# Patient Record
Sex: Female | Born: 1975
Health system: Southern US, Community
[De-identification: ages and names within clinical notes are randomized; demographics above are authoritative.]

## PROBLEM LIST (undated history)

## (undated) ENCOUNTER — Inpatient Hospital Stay (HOSPITAL_COMMUNITY): Payer: Self-pay

## (undated) DIAGNOSIS — N979 Female infertility, unspecified: Secondary | ICD-10-CM

## (undated) DIAGNOSIS — O24419 Gestational diabetes mellitus in pregnancy, unspecified control: Secondary | ICD-10-CM

## (undated) DIAGNOSIS — IMO0002 Reserved for concepts with insufficient information to code with codable children: Secondary | ICD-10-CM

## (undated) DIAGNOSIS — I10 Essential (primary) hypertension: Secondary | ICD-10-CM

## (undated) DIAGNOSIS — R51 Headache: Secondary | ICD-10-CM

## (undated) DIAGNOSIS — E039 Hypothyroidism, unspecified: Secondary | ICD-10-CM

## (undated) DIAGNOSIS — R87619 Unspecified abnormal cytological findings in specimens from cervix uteri: Secondary | ICD-10-CM

## (undated) HISTORY — PX: TONSILLECTOMY: SUR1361

## (undated) HISTORY — PX: LEEP: SHX91

## (undated) SURGERY — Surgical Case
Anesthesia: *Unknown

---

## 2006-04-01 LAB — HEPATITIS B SURFACE ANTIBODY,QUALITATIVE: Hep B S Ab: POSITIVE

## 2010-08-12 ENCOUNTER — Inpatient Hospital Stay (HOSPITAL_COMMUNITY)
Admission: AD | Admit: 2010-08-12 | Discharge: 2010-08-14 | DRG: 779 | Disposition: A | Discharge status: Home / Self Care | Payer: 59 | Source: Ambulatory Visit | Attending: Obstetrics | Admitting: Obstetrics

## 2010-08-12 DIAGNOSIS — E079 Disorder of thyroid, unspecified: Secondary | ICD-10-CM | POA: Diagnosis present

## 2010-08-12 DIAGNOSIS — O4100X0 Oligohydramnios, unspecified trimester, not applicable or unspecified: Secondary | ICD-10-CM | POA: Diagnosis present

## 2010-08-12 DIAGNOSIS — O034 Incomplete spontaneous abortion without complication: Principal | ICD-10-CM | POA: Diagnosis present

## 2010-08-12 DIAGNOSIS — O4290 Premature rupture of membranes, unspecified as to length of time between rupture and onset of labor, unspecified weeks of gestation: Secondary | ICD-10-CM

## 2010-08-12 DIAGNOSIS — O99280 Endocrine, nutritional and metabolic diseases complicating pregnancy, unspecified trimester: Secondary | ICD-10-CM | POA: Diagnosis present

## 2010-08-12 DIAGNOSIS — E039 Hypothyroidism, unspecified: Secondary | ICD-10-CM | POA: Diagnosis present

## 2010-08-13 ENCOUNTER — Inpatient Hospital Stay (HOSPITAL_COMMUNITY): Payer: 59

## 2010-08-13 LAB — DIFFERENTIAL
Eosinophils Absolute: 0.3 10*3/uL (ref 0.0–0.7)
Eosinophils Relative: 2 % (ref 0–5)
Lymphocytes Relative: 21 % (ref 12–46)
Lymphs Abs: 3 10*3/uL (ref 0.7–4.0)
Monocytes Relative: 7 % (ref 3–12)

## 2010-08-13 LAB — CBC
HCT: 37.5 % (ref 36.0–46.0)
HCT: 36.7 % (ref 36.0–46.0)
Hemoglobin: 13 g/dL (ref 12.0–15.0)
Hemoglobin: 12.5 g/dL (ref 12.0–15.0)
MCH: 32.5 pg (ref 26.0–34.0)
MCH: 32.6 pg (ref 26.0–34.0)
MCHC: 34.7 g/dL (ref 30.0–36.0)
MCV: 95.1 fL (ref 78.0–100.0)
MCV: 94.8 fL (ref 78.0–100.0)
Platelets: 259 10*3/uL (ref 150–400)
RBC: 3.96 MIL/uL (ref 3.87–5.11)
RBC: 3.85 MIL/uL — ABNORMAL LOW (ref 3.87–5.11)
RBC: 3.87 MIL/uL (ref 3.87–5.11)
WBC: 13.7 10*3/uL — ABNORMAL HIGH (ref 4.0–10.5)
WBC: 14.3 10*3/uL — ABNORMAL HIGH (ref 4.0–10.5)
WBC: 14.7 10*3/uL — ABNORMAL HIGH (ref 4.0–10.5)

## 2010-08-13 LAB — URINE MICROSCOPIC-ADD ON: WBC, UA: NONE SEEN WBC/hpf (ref <3)

## 2010-08-13 LAB — URINALYSIS, ROUTINE W REFLEX MICROSCOPIC
Glucose, UA: NEGATIVE mg/dL
Leukocytes, UA: NEGATIVE
Specific Gravity, Urine: <1.005 — ABNORMAL LOW (ref 1.005–1.030)
pH: 6 (ref 5.0–8.0)

## 2010-08-13 LAB — WET PREP, GENITAL

## 2010-08-13 LAB — TYPE AND SCREEN: ABO/RH(D): A POS

## 2010-08-14 ENCOUNTER — Other Ambulatory Visit: Payer: Self-pay | Admitting: Obstetrics and Gynecology

## 2010-08-14 LAB — URINE CULTURE

## 2010-08-18 ENCOUNTER — Inpatient Hospital Stay (HOSPITAL_COMMUNITY)
Admission: AD | Admit: 2010-08-18 | Discharge: 2010-08-18 | Disposition: A | Discharge status: Home / Self Care | Payer: 59 | Source: Ambulatory Visit | Attending: Obstetrics and Gynecology | Admitting: Obstetrics and Gynecology

## 2010-08-18 ENCOUNTER — Inpatient Hospital Stay (HOSPITAL_COMMUNITY): Payer: 59

## 2010-08-18 DIAGNOSIS — O034 Incomplete spontaneous abortion without complication: Secondary | ICD-10-CM | POA: Insufficient documentation

## 2010-08-18 LAB — URINALYSIS, ROUTINE W REFLEX MICROSCOPIC
Bilirubin Urine: NEGATIVE
Ketones, ur: NEGATIVE mg/dL
Leukocytes, UA: NEGATIVE
Nitrite: NEGATIVE
Protein, ur: NEGATIVE mg/dL
Urobilinogen, UA: 0.2 mg/dL (ref 0.0–1.0)

## 2010-08-18 LAB — CBC
MCH: 33.6 pg (ref 26.0–34.0)
MCHC: 34.8 g/dL (ref 30.0–36.0)
MCV: 96.5 fL (ref 78.0–100.0)
Platelets: 287 10*3/uL (ref 150–400)
RBC: 4.23 MIL/uL (ref 3.87–5.11)
RDW: 12.8 % (ref 11.5–15.5)

## 2010-08-18 LAB — URINE MICROSCOPIC-ADD ON

## 2010-08-25 DEATH — deceased

## 2010-08-27 NOTE — Discharge Summary (Signed)
  NAMEMARIKO, Sheila Evans NO.:  [id number]  MEDICAL RECORD NO.:  [id number]  LOCATION:  9374                          FACILITY:  [facility]  PHYSICIAN:  Maxie Better, M.D.DATE OF BIRTH:  12-14-1975  DATE OF ADMISSION:  08/12/2010 DATE OF DISCHARGE:  08/14/2010                              DISCHARGE SUMMARY   ADMISSION DIAGNOSES:  Preterm premature rupture of membranes, intrauterine gestation at 26 1/7 weeks, hypothyroidism.  DISCHARGE DIAGNOSES:  18 1/7 weeks delivered, hypothyroidism, preterm premature rupture of membranes.  PROCEDURE:  Second trimester termination of pregnancy.  HISTORY OF PRESENT ILLNESS:  This is a 35 year old, gravida 2, para 0-0- 1-0 female at 18-1/7 weeks by first trimester ultrasound with known hypothyroidism, on Synthroid, who presented to the hospital with feeling a pop while straining for bowel movement.  The patient was subsequently diagnosed with rupture of membranes confirmed by ultrasound.  HOSPITAL COURSE:  The patient underwent an ultrasound that showed fundal placenta breech, gross loss of fluid noted, limited ultrasound. Patient had CBC done as well as Hemaglobin A1C which was mildly increased at 6.3 The patient does not have a history of abnormal glucose or diabetes. Several CBC was done to assess for  evidence of immediate infection. On admission, CBC showed wbc 13.7, hemaglobin 12, hematocrit of 37, platelet count of 264,000. Repeat CBC prior to procedure showed wbc of 14.7, hemaglobin 12.6, hematocrit of 36.7, platelet count of 259,000. The patient  remained afebrile and uterus was not tender. Amniotic fluid was without any odor. The patient was offered Maternal fetal medicine consult and opted to have that input.  After deliberation post MFM consult, the couple opted to terminate the pregnancy.  The patient was transferred to the Intensive Care Unit where she underwent Cytotec induction of her second trimester  procedure. After 600 mcg of Cytotec placed vaginally, the patient subsequently delivered a nonviable female and  placenta, which subsequently came out intact and minimal bleeding was noted thereafter.  After being monitored carefully, the patient was ultimately discharged home and was not felt to need any D and C due to the minimal bleeding.  Uterus was firm and the placenta appearing intact, specimen all of which was sent to Pathology.  The patient was grieving appropriately.  She was given information on [facility].  The patient was subsequently discharged home.   DISPOSITION:  Home.  CONDITION:  Stable.  DISCHARGE MEDICATIONS:  Xanax 0.25 mg p.o. t.i.d. p.r.n. per the patient's request.  Continue her Synthroid dosage as scheduled.  The patient already had Motrin at home.  DISCHARGE INSTRUCTIONS:  Call for temperature greater than or equal to 100.4, nothing per vagina for 2-3 weeks.  Call if soaking the regular maxi pad every hour or more frequently or passage of clots.  FOLLOW UP: [facility] 2 wk  Maxie Better, M.D.     [place]/[facility]  D:  08/26/2010  T:  08/27/2010  Job:  [id number]  Electronically Signed by Nena Jordan Salah Burlison M.D. on 08/27/2010 06:25:42 AM

## 2010-10-26 ENCOUNTER — Inpatient Hospital Stay (HOSPITAL_COMMUNITY): Admission: AD | Admit: 2010-10-26 | Payer: Self-pay | Source: Ambulatory Visit | Admitting: Obstetrics and Gynecology

## 2011-04-10 ENCOUNTER — Other Ambulatory Visit (HOSPITAL_COMMUNITY): Payer: Self-pay | Admitting: Obstetrics and Gynecology

## 2011-04-10 DIAGNOSIS — IMO0001 Reserved for inherently not codable concepts without codable children: Secondary | ICD-10-CM

## 2011-04-10 DIAGNOSIS — N883 Incompetence of cervix uteri: Secondary | ICD-10-CM

## 2011-04-11 ENCOUNTER — Ambulatory Visit (HOSPITAL_COMMUNITY)
Admission: RE | Admit: 2011-04-11 | Discharge: 2011-04-11 | Disposition: A | Discharge status: Home / Self Care | Payer: 59 | Source: Ambulatory Visit | Attending: Obstetrics and Gynecology | Admitting: Obstetrics and Gynecology

## 2011-04-11 ENCOUNTER — Encounter (HOSPITAL_COMMUNITY): Payer: Self-pay

## 2011-04-11 DIAGNOSIS — O30009 Twin pregnancy, unspecified number of placenta and unspecified number of amniotic sacs, unspecified trimester: Secondary | ICD-10-CM | POA: Insufficient documentation

## 2011-04-11 DIAGNOSIS — O09299 Supervision of pregnancy with other poor reproductive or obstetric history, unspecified trimester: Secondary | ICD-10-CM

## 2011-04-11 DIAGNOSIS — O99280 Endocrine, nutritional and metabolic diseases complicating pregnancy, unspecified trimester: Secondary | ICD-10-CM | POA: Insufficient documentation

## 2011-04-11 DIAGNOSIS — E039 Hypothyroidism, unspecified: Secondary | ICD-10-CM | POA: Insufficient documentation

## 2011-04-11 DIAGNOSIS — O3440 Maternal care for other abnormalities of cervix, unspecified trimester: Secondary | ICD-10-CM | POA: Insufficient documentation

## 2011-04-11 DIAGNOSIS — IMO0001 Reserved for inherently not codable concepts without codable children: Secondary | ICD-10-CM

## 2011-04-11 DIAGNOSIS — E079 Disorder of thyroid, unspecified: Secondary | ICD-10-CM | POA: Insufficient documentation

## 2011-04-11 DIAGNOSIS — O30049 Twin pregnancy, dichorionic/diamniotic, unspecified trimester: Secondary | ICD-10-CM

## 2011-04-11 DIAGNOSIS — N883 Incompetence of cervix uteri: Secondary | ICD-10-CM

## 2011-04-12 NOTE — Progress Notes (Signed)
Obstetric ultrasound performed today.    Patient presented today for second opion regarding her cervical length and pregnancy management.    Transvaginal evaluation of cervical length reveals a residual length of 2.9cm.  There does appear to be a moderate amount of funnelling, but differentiation of the lower uterus from the cervix can be challenging at this gestational age.  Given this, interpretation of these findings is difficult.  This was discussed with the patient today.  Formal MFM consultation was also completed today (see separate documentation).    Gestation appears to have 2 separate placental discs, a thick dividing membrane and a lambda sign consistent with dichorionic placentation.   Diamniotic, dichorionic twin gestation at 13 weeks 6 days No gross abnormalities Equivocal cervical length   Recommend repeat evaluation of cervical length in 10-14 days.  Recommend repeat ultrasound for evaluation of fetal anatomy in 5 weeks.  We would be pleased to perform this exam.  If desired, please call to schedule.   Please see MFM consult for further recommendations.

## 2011-04-12 NOTE — Progress Notes (Signed)
MFM CONSULT  36 y/o G3P0020 at 60 6/7 weeks with dichorionic twin gestation and prior history of 2nd trimester PPROM presents for consultation for recommendations regarding pregnancy management.  Gestation is IVP conception with donor sperm (husband has a prior vasectomy).   No complaints today.   Medical history is significant for hypothyroidism.  Patient is currently treated with Synthroid 125 mcg daily.  Prenatal records indicate normal TSH is peri conception period.   No other significant medical history.  Surgical history is significant for a prior LEEP.    Obstetric history includes a TAB in 2002 in early first trimester.  Patient also has a history of PPROM at 18 weeks in June 2012.  Patient reports presenting after she experienced loss of large amount of fluid per vagina while straining to have a bowel movement.  She denies any other symptoms preceding this event.  She reports electing to proceed with Cytotec induction due to the overall poor prognosis and states she was told her cervix was closed and long at the initiation of the induction.  She had no post delivery complications.    Patient expresses extreme concern and anxiety regarding the possibility of pregnancy loss with this gestation.  The uncertain benefit of cerclage and progesterone therapy in twin gestations was discussed today.  The limitations of cervical length ultrasound at this gestational age was also described.    Options for pregnancy management were reviewed including prophylactic cerclage in the next 1-2 weeks, serial cervical length evaluations, and IM weekly progesterone injections.  The risks and potential benefits of each were described and questions were answered.    Given prior cervical manipulation with a TAB and LEEP, the question of some changes on cervical ultrasound currently, and a prior history of second trimester loss, patient may benefit from a prophylactic cerclage, but the possible reduced effectiveness of  this approach in a twin gestation and the uncertain outcome with or without any of the described interventions was emphasized.   Patient desires to consider her options and discuss these further with her primary MD.    Recommendations were provided for a repeat cervical length in 10-14 days.  Concerning signs and symptoms were discussed.   Also, recommend TSH evaluation at least every trimester during gestation and at 6 weeks post partum to maintain euthyroid state.  Benefits of this were briefly reviewed.   We would be pleased to participate further in the care of this patient as desired.  40 min counseling time

## 2011-04-14 ENCOUNTER — Ambulatory Visit (HOSPITAL_COMMUNITY): Admit: 2011-04-14 | Discharge: 2011-04-14 | Disposition: A | Discharge status: Home / Self Care | Payer: 59

## 2011-04-14 ENCOUNTER — Ambulatory Visit (HOSPITAL_COMMUNITY)
Admit: 2011-04-14 | Discharge: 2011-04-14 | Disposition: A | Discharge status: Home / Self Care | Payer: 59 | Attending: Obstetrics and Gynecology | Admitting: Obstetrics and Gynecology

## 2011-04-14 ENCOUNTER — Encounter (HOSPITAL_COMMUNITY): Payer: Self-pay | Admitting: *Deleted

## 2011-04-14 ENCOUNTER — Inpatient Hospital Stay (HOSPITAL_COMMUNITY): Payer: 59

## 2011-04-14 ENCOUNTER — Other Ambulatory Visit (HOSPITAL_COMMUNITY): Payer: Self-pay | Admitting: Obstetrics and Gynecology

## 2011-04-14 ENCOUNTER — Inpatient Hospital Stay (HOSPITAL_COMMUNITY)
Admission: AD | Admit: 2011-04-14 | Discharge: 2011-04-14 | Disposition: A | Discharge status: Home / Self Care | Payer: 59 | Source: Ambulatory Visit | Attending: Obstetrics and Gynecology | Admitting: Obstetrics and Gynecology

## 2011-04-14 DIAGNOSIS — IMO0001 Reserved for inherently not codable concepts without codable children: Secondary | ICD-10-CM

## 2011-04-14 DIAGNOSIS — O4290 Premature rupture of membranes, unspecified as to length of time between rupture and onset of labor, unspecified weeks of gestation: Secondary | ICD-10-CM

## 2011-04-14 DIAGNOSIS — O200 Threatened abortion: Secondary | ICD-10-CM | POA: Insufficient documentation

## 2011-04-14 DIAGNOSIS — O99280 Endocrine, nutritional and metabolic diseases complicating pregnancy, unspecified trimester: Secondary | ICD-10-CM

## 2011-04-14 DIAGNOSIS — O09299 Supervision of pregnancy with other poor reproductive or obstetric history, unspecified trimester: Secondary | ICD-10-CM

## 2011-04-14 HISTORY — DX: Female infertility, unspecified: N97.9

## 2011-04-14 HISTORY — DX: Unspecified abnormal cytological findings in specimens from cervix uteri: R87.619

## 2011-04-14 HISTORY — DX: Essential (primary) hypertension: I10

## 2011-04-14 HISTORY — DX: Reserved for concepts with insufficient information to code with codable children: IMO0002

## 2011-04-14 HISTORY — DX: Hypothyroidism, unspecified: E03.9

## 2011-04-14 HISTORY — DX: Headache: R51

## 2011-04-14 NOTE — Discharge Instructions (Signed)
Go from MAU to Maternal Fetal Care. Then follow-up with Dr. Cherly Hensen as directed from there. Call [facility] or the MD on call with any concerns beyond appointment today.

## 2011-04-14 NOTE — Progress Notes (Signed)
Had a sharp pain last night across lower abd.  Sneezed this morning, with 2nd sneeze- water exploded, running down legs.  No bleeding.  Feeling a little cramping.  (pt brought directly from lobby to rm via wc

## 2011-04-14 NOTE — Progress Notes (Signed)
Twin gest, 2 sacs.  Had [place] at MFM last wk, cervix 2.3cm on Wed, 2.8cm on Fri.  Hx of srom at 18wks ( was pushing with bowel movement)- states had a feeling something was wrong, very scared and worried.

## 2011-04-14 NOTE — Progress Notes (Signed)
sono done; twin gestation w/ oligo on 1st twin. Had sono last fri @ MFM and was fine.  Pt c/o pinkish d/c and cramping since exam.   IMP: prob PROM  P)MFM

## 2011-04-14 NOTE — ED Notes (Signed)
Patient asking, "Is this normal for them to just let someone go like this?" Informed patient and husband that medications to prevent preterm labor are not appropriate at this gestational age. Informed patient and husband that a cerclage or progesterone shots or bedrest would not have prevented the membranes from rupturing. Attempted to console patient and offer encouragement. Advised that patient and husband ask detailed questions at [facility]. Then F/U with Dr. Cherly Hensen to ensure that they are informed and questions answered regarding what to expect and plan of care.

## 2011-04-14 NOTE — ED Notes (Signed)
Dr. Cherly Hensen called in. States she will consult with [facility]. Will be in to see patient or send patient to [facility].

## 2011-04-14 NOTE — ED Notes (Signed)
Dr. Cherly Hensen states fern slide negative. U/S ordered.

## 2011-04-14 NOTE — Progress Notes (Signed)
History    36 yo G3P0020 MWF now @ 14 2/7 wk twins(DIDI) gestation present for evaluation of c/o leaking of fluid with sneezing. No vaginal bleeding  Chief Complaint  Patient presents with  . Threatened Miscarriage   Cc leaking fluid  OB History    Grav Para Term Preterm Abortions TAB SAB Ect Mult Living   3 0 0 0 2 1 1 0 0 0       Past Medical History  Diagnosis Date  . Headache     teenager  . Hypertension   . Hypothyroid   . Abnormal Pap smear     leep, 3 normal since    Past Surgical History  Procedure Date  . Leep   . Leep   . Tonsillectomy     Family History  Problem Relation Age of Onset  . Anesthesia problems Neg Hx     History  Substance Use Topics  . Smoking status: Former Games developer  . Smokeless tobacco: Never Used  . Alcohol Use: No    Allergies:  Allergies  Allergen Reactions  . Latex Rash    Prescriptions prior to admission  Medication Sig Dispense Refill  . levothyroxine (SYNTHROID, LEVOTHROID) 125 MCG tablet Take 125 mcg by mouth daily.      . Prenatal Vit-Fe Fumarate-FA (PRENATAL MULTIVITAMIN) TABS Take 1 tablet by mouth daily.         Physical Exam   Blood pressure 142/63, pulse 97, temperature 98.6 F (37 C), temperature source Oral, resp. rate 20, height 5' 5.75" (1.67 m), weight 121.564 kg (268 lb), last menstrual period 01/04/2011.  General appearance: alert and cooperative Heart: regular rate and rhythm, S1, S2 normal, no murmur, click, rub or gallop Abdomen: soft, non-tender; bowel sounds normal; no masses,  no organomegaly Pelvic: cervix normal in appearance.  No fluid in vault. Mucoid d/c cervix closed. Gravid uterus Fern neg ED Course  TWIN gestation IUP @ 14 2/7 wk  R/o PROM  P) sonogram  MDM  Beverley Sherrard A, MD 8:58 AM 04/14/2011

## 2011-04-15 ENCOUNTER — Other Ambulatory Visit: Payer: Self-pay

## 2011-04-15 ENCOUNTER — Inpatient Hospital Stay (HOSPITAL_COMMUNITY)
Admission: AD | Admit: 2011-04-15 | Discharge: 2011-04-25 | DRG: 781 | Disposition: A | Discharge status: Home / Self Care | Payer: 59 | Source: Ambulatory Visit | Attending: Obstetrics | Admitting: Obstetrics

## 2011-04-15 DIAGNOSIS — O99280 Endocrine, nutritional and metabolic diseases complicating pregnancy, unspecified trimester: Secondary | ICD-10-CM | POA: Diagnosis present

## 2011-04-15 DIAGNOSIS — E669 Obesity, unspecified: Secondary | ICD-10-CM | POA: Diagnosis present

## 2011-04-15 DIAGNOSIS — O4290 Premature rupture of membranes, unspecified as to length of time between rupture and onset of labor, unspecified weeks of gestation: Secondary | ICD-10-CM | POA: Diagnosis present

## 2011-04-15 DIAGNOSIS — E079 Disorder of thyroid, unspecified: Secondary | ICD-10-CM | POA: Diagnosis present

## 2011-04-15 DIAGNOSIS — O4590 Premature separation of placenta, unspecified, unspecified trimester: Secondary | ICD-10-CM | POA: Diagnosis present

## 2011-04-15 DIAGNOSIS — O034 Incomplete spontaneous abortion without complication: Secondary | ICD-10-CM | POA: Diagnosis not present

## 2011-04-15 DIAGNOSIS — O30049 Twin pregnancy, dichorionic/diamniotic, unspecified trimester: Secondary | ICD-10-CM

## 2011-04-15 DIAGNOSIS — E039 Hypothyroidism, unspecified: Secondary | ICD-10-CM | POA: Diagnosis present

## 2011-04-15 DIAGNOSIS — IMO0002 Reserved for concepts with insufficient information to code with codable children: Principal | ICD-10-CM | POA: Diagnosis not present

## 2011-04-15 DIAGNOSIS — O3430 Maternal care for cervical incompetence, unspecified trimester: Secondary | ICD-10-CM | POA: Diagnosis present

## 2011-04-15 NOTE — Progress Notes (Signed)
MATERNAL FETAL MEDICINE CONSULT  Patient Name: Sheila Evans Medical Record Number:  [id number] Date of Birth: 30-Sep-1975 Requesting Physician Name: Dr. Cherly Hensen Date of Service: 04/15/2011  Chief Complaint PPROM  History of Present Illness Vertie Dibbern was seen today  secondary to PPROM at the request of Dr Cherly Hensen.  The patient is a 36 y.o. G3P0020,with a dichorionic diamniotic IVF twin gestation at [redacted]w[redacted]d with an EDD of 10/11/2011, by Last Menstrual Period confirmed with first trimester ultrasound.  She experienced a gush of fluid earlier today a fter sneezing.  She denies fevers, chills, abdominal pain, contractions, or vaginal bleeding.  Her pregnancy history is significant for a 18 weeks fetal loss secondary to PPROM.  She was seen for a consult in the [facility] on Friday for this history and found to have a transvaginal cervical length of 2.8 cm.  Today's ultrasound shows oligohydramnios of Twin A and normal amniotic fluid volume of Twin B.  Cervical length was 3 cm on transabdominal ultrasound.  Review of Systems Pertinent items are noted in HPI.  Patient History OB History    Grav Para Term Preterm Abortions TAB SAB Ect Mult Living   3 0 0 0 2 1 1 0 0 0      # Outc Date GA Lbr Len/2nd Wgt Sex Del Anes PTL Lv   1 SAB            Comments: 18wks- srom   2 TAB            3 CUR               Past Medical History  Diagnosis Date  . Headache     teenager  . Hypertension   . Hypothyroid   . Abnormal Pap smear     leep, 3 normal since  . Infertility, female     Past Surgical History  Procedure Date  . Leep   . Leep   . Tonsillectomy     History   Social History  . Marital Status: Married    Spouse Name: N/A    Number of Children: N/A  . Years of Education: N/A   Social History Main Topics  . Smoking status: Former Games developer  . Smokeless tobacco: Never Used  . Alcohol Use: No  . Drug Use: No  . Sexually Active: Not Currently   Other Topics Concern  . Not on file    Social History Narrative  . No narrative on file    Family History  Problem Relation Age of Onset  . Anesthesia problems Neg Hx    In addition, the patient has no family history of mental retardation, birth defects, or genetic diseases.  Physical Examination General appearance - alert, well appearing, and in no distress Abdomen - soft, nontender, nondistended, no masses or organomegaly  Assessment and Recommendations 1.  PRROM of Twin A.  I reviewed the ultrasound findings with Ms. Coltrain and her husband and discussed the poor prognosis given PPROM at such and early gestational age.  It is very likely that she will go into labor or develop and infection or abruption prior to fetal viability.  Even if she is fortunate enough to continue pregnancy past viability, there is a very high risk of pulmonary hypoplasia and neonatal death for Twin A due to prolonged oligohydramnios.  Twin B would not have the risk of pulmonary hypoplasia in that circumstance but would likely have siginifcant risks associated with prematurity.  Unfortunately, there are no proven effective interventions  for a woman in Ms. Stempel's situation.  Cerclage placement is contraindicated in PPROM.  Supplemental progesterone has not been shown to prevent preterm birth in multiple gestations.  However, as the patient is wanting to do everything possible it would be reasonable to proceed with weekly IM  progesterone as it has few associated risks.  I have also prescribed a weeks course of amoxicillin 500 mg po tid and a dose of azithromycin 1g po x1 to prolong latency from rupture to onset of labor.  We also discussed the heroic option of cerclage placement should Twin A spontaneously delivery prior to viability.  The patient understands this is unlikely to be possible as labor typically continues after delivery of the first twin such that delivery of Twin B usually follows soon afterward.  Only if labor were to subside after delivery  of Twin A would cerclage be considered.  We will initally follow her with weekly ultrasounds to assess fluid volume of Twin A and cervical length.  I spent 30 minutes with Ms. Shehadeh today of which 50% was face-to-face counseling.   Rema Fendt, MD

## 2011-04-15 NOTE — Progress Notes (Signed)
Pt presents from home states she delivered twin A at home at 2305, has had PROM with twin A x 24 hours. States she did not have pain tonight just felt something passing through the vagina and the baby passed , still attached to the umbilical cord. She states the umbilical cord pulled out and the placenta is still inside, no bleeding now. Baby is wrapped in a cloth, no heartrate detected, no movement.

## 2011-04-15 NOTE — Progress Notes (Signed)
Infant placed in blanket and mom will hold, pt and husband decline chaplain at this time.

## 2011-04-16 ENCOUNTER — Other Ambulatory Visit: Payer: Self-pay | Admitting: Obstetrics and Gynecology

## 2011-04-16 ENCOUNTER — Inpatient Hospital Stay (HOSPITAL_COMMUNITY): Payer: 59

## 2011-04-16 ENCOUNTER — Ambulatory Visit (HOSPITAL_COMMUNITY)
Admit: 2011-04-16 | Discharge: 2011-04-16 | Disposition: A | Discharge status: Home / Self Care | Payer: 59 | Attending: Obstetrics | Admitting: Obstetrics

## 2011-04-16 LAB — MRSA PCR SCREENING: MRSA by PCR: NEGATIVE

## 2011-04-16 LAB — TYPE AND SCREEN
ABO/RH(D): A POS
Antibody Screen: NEGATIVE

## 2011-04-16 LAB — HEMOGLOBIN A1C: Hgb A1c MFr Bld: 6.1 % — ABNORMAL HIGH (ref <5.7)

## 2011-04-16 LAB — CBC
Hemoglobin: 13 g/dL (ref 12.0–15.0)
MCH: 32.4 pg (ref 26.0–34.0)
MCV: 95.2 fL (ref 78.0–100.0)
Platelets: 266 10*3/uL (ref 150–400)
Platelets: 238 10*3/uL (ref 150–400)
RBC: 4 MIL/uL (ref 3.87–5.11)
RDW: 12.8 % (ref 11.5–15.5)
WBC: 15.9 10*3/uL — ABNORMAL HIGH (ref 4.0–10.5)
WBC: 14.8 10*3/uL — ABNORMAL HIGH (ref 4.0–10.5)

## 2011-04-16 LAB — FIBRINOGEN: Fibrinogen: 577 mg/dL — ABNORMAL HIGH (ref 204–475)

## 2011-04-16 LAB — TSH: TSH: 7.016 u[IU]/mL — ABNORMAL HIGH (ref 0.350–4.500)

## 2011-04-16 LAB — APTT: aPTT: 29 seconds (ref 24–37)

## 2011-04-16 MED ORDER — LACTATED RINGERS IV BOLUS (SEPSIS)
1000.0000 mL | Freq: Once | INTRAVENOUS | Status: AC
  Administered 2011-04-15: 1000 mL via INTRAVENOUS

## 2011-04-16 MED ORDER — AMOXICILLIN 500 MG PO CAPS
500.0000 mg | ORAL_CAPSULE | Freq: Once | ORAL | Status: AC
  Administered 2011-04-16: 500 mg via ORAL
  Filled 2011-04-16: qty 1

## 2011-04-16 MED ORDER — ACETAMINOPHEN 325 MG PO TABS
650.0000 mg | ORAL_TABLET | ORAL | Status: DC | PRN
  Administered 2011-04-25: 650 mg via ORAL
  Filled 2011-04-16: qty 2

## 2011-04-16 MED ORDER — AMOXICILLIN 500 MG PO CAPS
500.0000 mg | ORAL_CAPSULE | Freq: Three times a day (TID) | ORAL | Status: AC
  Administered 2011-04-16 – 2011-04-20 (×14): 500 mg via ORAL
  Filled 2011-04-16 (×16): qty 1

## 2011-04-16 MED ORDER — AMOXICILLIN 500 MG PO CAPS: 500.0000 mg | ORAL_CAPSULE | Freq: Three times a day (TID) | ORAL | Status: DC

## 2011-04-16 MED ORDER — LEVOTHYROXINE SODIUM 125 MCG PO TABS
125.0000 ug | ORAL_TABLET | Freq: Every day | ORAL | Status: DC
  Administered 2011-04-16: 125 ug via ORAL
  Filled 2011-04-16 (×2): qty 1

## 2011-04-16 MED ORDER — PRENATAL MULTIVITAMIN CH
1.0000 | ORAL_TABLET | Freq: Every day | ORAL | Status: DC
  Administered 2011-04-16 – 2011-04-24 (×7): 1 via ORAL
  Filled 2011-04-16 (×8): qty 1

## 2011-04-16 MED ORDER — SALINE SPRAY 0.65 % NA SOLN
1.0000 | NASAL | Status: DC | PRN
Start: 2011-04-16 — End: 2011-04-25
  Administered 2011-04-16: 1 via NASAL
  Filled 2011-04-16: qty 44

## 2011-04-16 MED ORDER — LEVOTHYROXINE SODIUM 137 MCG PO TABS
137.0000 ug | ORAL_TABLET | Freq: Every day | ORAL | Status: DC
  Administered 2011-04-17: 137 ug via ORAL
  Administered 2011-04-18: 50 ug via ORAL
  Administered 2011-04-19 – 2011-04-25 (×7): 137 ug via ORAL
  Filled 2011-04-16 (×10): qty 1

## 2011-04-16 MED ORDER — CALCIUM CARBONATE ANTACID 500 MG PO CHEW
2.0000 | CHEWABLE_TABLET | ORAL | Status: DC | PRN
  Administered 2011-04-16 – 2011-04-24 (×7): 400 mg via ORAL
  Filled 2011-04-16 (×2): qty 1
  Filled 2011-04-16 (×6): qty 2

## 2011-04-16 MED ORDER — DOCUSATE SODIUM 100 MG PO CAPS
100.0000 mg | ORAL_CAPSULE | Freq: Every day | ORAL | Status: DC
  Administered 2011-04-16 – 2011-04-24 (×9): 100 mg via ORAL
  Filled 2011-04-16 (×9): qty 1

## 2011-04-16 MED ORDER — ZOLPIDEM TARTRATE 10 MG PO TABS
10.0000 mg | ORAL_TABLET | Freq: Every evening | ORAL | Status: DC | PRN
  Administered 2011-04-16 – 2011-04-24 (×10): 10 mg via ORAL
  Filled 2011-04-16 (×10): qty 1

## 2011-04-16 NOTE — Progress Notes (Signed)
MFM Note  Patient known to MFM service. 36 year old G3P0A2 at 14+4 weeks was admitted last PM after delivering Twin A at home after PPROM earlier that morning. Placenta of Twin A remains undelivered. [place] today revealed Twin B to be active with normal amniotic fluid. Both placentas identified. Cervix appeared long and closed. Since admission, Sheila Evans has received oral antibiotics and IV fluids. She's has some small gushes of BRB but none in several hours. She denies any fevers, cramping or leakage of fluid.  I had a lengthy, detailed discussion with Sheila Evans and her husband. We reviewed the possible management options including expectant management and cerclage placement. Due to the low numbers of this particular clinical situation, there are very little data to help guide management. Sheila Evans is very aware of the risk of infection which could lead quickly to sepsis and death.   Sheila Evans very much would like to have a cerclage placed. It was decided to wait at least 24 more hours before making a final decision. We will plan to look at the pregnancy again tomorrow in the [facility].

## 2011-04-16 NOTE — Progress Notes (Signed)
Vital signs taken prior to ultrasound. Pt oob to bathroom. Pt denies pain and reports pink discharge at this time

## 2011-04-16 NOTE — Progress Notes (Signed)
HD#1, PPROM/ delivery twin A  S: Pt notes no contractions, no abdominal pain, no fevers, pt notes slight blood tinged mucous discharge but no vaginal bleeding since large gush last pm in MAU. Pt again expressing hope to continue with pregnancy of twin A and hoping to be able to receive a cerclage (this is her impression of the next step after MFM consultation w/ Dr. Carney Bern on Mon).   OCeasar Mons Vitals:   04/16/11 0407 04/16/11 0543 04/16/11 0800 04/16/11 0807  BP: 141/58   125/67  Pulse: 93     Temp: 97.9 F (36.6 C) 98 F (36.7 C) 97.7 F (36.5 C)   TempSrc: Oral  Oral   Resp: 20   18  Height:      Weight:      SpO2: 98%      Gen: in bed, no distress CV: RRR Pulm: CTAB Abd: obese, NT, no RUQ pain LE: NT, no edema GU: deferred today  U/s this am, bedside: active movement baby B, no FH measured, placenta B fundal, placenta A in lower segment  Rads u/s last pm: active FH B, fundal placenta B, placenta of A in lower segment, cvx 4 cm long, grossly nl fluid baby B  CBC    Component Value Date/Time   WBC 14.8* 04/16/2011 0538   RBC 3.55* 04/16/2011 0538   HGB 11.5* 04/16/2011 0538   HCT 33.8* 04/16/2011 0538   PLT 238 04/16/2011 0538   MCV 95.2 04/16/2011 0538   MCH 32.4 04/16/2011 0538   MCHC 34.0 04/16/2011 0538   RDW 12.8 04/16/2011 0538   LYMPHSABS 3.0 08/13/2010 1715   MONOABS 1.0 08/13/2010 1715   EOSABS 0.3 08/13/2010 1715   BASOSABS 0.0 08/13/2010 1715    A1C 6.1  TSH/ GC/CT/ Ucx pending  A/P: 36 yo G3P0020 at 14'4 w/ PPROM and delivery of baby A, intact baby B w/ 3-4cm cervical length hopeful for conservation of baby B. Pt w/ risk factors for cervical incompetence (prior TOP/D&C; prior LEEP, h/o PPROM at 18 wks) but cvx was closed w/ no clear funnelling on exams last wk or this wk. Unclear etiology of PPROM. Clinically pt does not seem infected. WBC elevated but slightly decreased from yest (likely hemodilution). Pt would like to be aggressive as possible. She does understand  risks of bleeding, hemorrhage, DIC all of which would necessitate D&C. We also discussed infectious risk and that placental tissue of A represents significant infectious risk, esp in setting of PPROM (usually caused by infection). Pt understands she is remote from viability but still interested in being aggressive. Will await MFM recommendation on management at this point.   Greater than 30 minutes spent on floor with pt examining, counseling and coordinating care.   Bowdy Bair A. 04/16/2011 9:32 AM

## 2011-04-16 NOTE — Progress Notes (Signed)
Pt was previously under my care. Her care has been transferred to Dr. Ernestina Penna per her request  Clarification of notes reviewed; 1) PPROM at 18 wk with other pregnancy was shortly after comprehensive study with normal cervical length and pt did not have shortening or dilation of cervix post rupture and was seen during that event by MFM who concur that there was no evidence of cervical incompetence.   The MFM consult done on 2/15 reported an equivocal cervical length given the gestational age and difficulty in assessment and recommended repeat cervical length 1 wk later

## 2011-04-16 NOTE — Progress Notes (Signed)
Dr. Eber Jones note seen. Will move pt to floor for continued expectant management. Daily FH, routine vitals, CBC in am. Will continue to monitor for signs of infection, bleeding, abruption, delivery of placenta of A and delivery of B. Cont course of po amoxicillin. Elevated TSH noted and dose adjustment made. Will f/u with pt tomorrow after MFM recommendations.   Sheila Evans A. 04/16/2011 7:20 PM

## 2011-04-16 NOTE — Progress Notes (Signed)
UR chart review completed.  

## 2011-04-16 NOTE — Progress Notes (Signed)
New admit - pt rec'd via stretcher from MAU.  G3P0 AB2 at 14.4 week twin gestation.  Delivered twin A at home,  Desires MFM consult in AM re Twin B.  Small amt bright red vaginal bleeding on admission, denies discomfort.  FHR Twin B 152 per U/S.  IV LR inf on admission.  Abd palpated soft, non tender.  Pt transferred easily to bed.  SR up x 2.  VS obtained.  Oriented to room.  Aware of plan of care as discussed with Dr. Prudencio Pair.

## 2011-04-16 NOTE — H&P (Addendum)
Chief Complaint  PPROM/ delivery baby A  History of Present Illness  36 y.o. G3P0020,with a dichorionic diamniotic IUI twin gestation at [redacted]w[redacted]d with an EDD of 10/11/2011, by Last Menstrual Period confirmed with first trimester ultrasound. She experienced a gush of fluid yesterday  after sneezing. She was confirmed ROM of baby A with a normal cervical length. She was counseled by MFM about poor prognosis of twin A and  Started on Azithro and Amoxicillin. She did not start weekly 17-P. Plan was to f/u for u/s in 1 wk.   Her pregnancy history is significant for a 18 weeks fetal loss secondary to PPROM and prior LEEP for CIN 2. At the time of 18 wk PPROM, after counseling pt had cytotec IOL and delivery. Her cervix was apparently closed at that time. She was seen for a consult in the [facility] on Friday for this history and found to have a transvaginal cervical length of 2.8 cm with concern for funnelling. Ultrasound 2/18 shows oligohydramnios of Twin A and normal amniotic fluid volume of Twin B. Cervical length was 3 cm on transabdominal ultrasound.  Pt notes no pain, bleeding only just starting now while in MAU. No fevers. Pt asking for high ligation of cord with attempt to continue pregnancy w/ B. Pt hopeful to have cervical cerclage if no evidence labor or infection after 24 hrs as this was discussed 2 days ago with Dr. Carney Bern.  Pt here w/ baby A, female, in towel.  PMH: Hypothyroidism Obesity HA  PSH: LEEP, D&C (TOP in 2002)  All: latex  Meds: PNV, SYnthroid, Azitho, Amox  PE: Filed Vitals:   04/15/11 2338  BP: 149/89  Pulse: 101  Temp: 98.8 F (37.1 C)  Resp: 20   Gen: appropriately anxious, no distress Abd: obese, NT LE: NT, no edema GU: large gush of blood in toilet, followed by 50cc clot in vagina, cvx FT/ 2 cm long. No further active bleeding, no fetal parts, no cord, no placenta in vagina.  U/s pending  A/P: 36 yo G3P0020 at 14'4 with PPROM of baby A and now with delivery of A.  No evidence of labor or infection. Pt hopeful to maintain her pregnancy of baby B but understands this is a high risk situation with labor, hemorrhage, infection possible. Pt interested in cerclage and we have discussed that I would like further MFM input. At this time will plan u/s to confirm baby B still w/ intact membranes and no evidence abruption of B. Will cont latency abx and f/u after MFM consult. Dr. Ernestina Penna to manage pt and correspond with MFM per pt's request. Unclear etiology of PPROM, suspect cervical incompetence given funnelling last wk and assessment 2d ago by abdominal means only. Pt also at risk for incompetence given prior LEEP and prior D&C.   - sleep d/o. Pt has been on Ambien for past 2 wks and would like to cont tonight  - hypothyroid. TSH to be sent in am  - Obesity, no recent DM screening, borderline A1C (6.3) last yr, will add to blood work in am.   Sheila Evans A. 04/16/2011 1:31 AM   Greater than 60 minutes spent on floor with pt examining, counseling and coordinating care.

## 2011-04-17 ENCOUNTER — Inpatient Hospital Stay (HOSPITAL_COMMUNITY): Payer: 59

## 2011-04-17 LAB — APTT: aPTT: 28 seconds (ref 24–37)

## 2011-04-17 LAB — CBC
HCT: 33.3 % — ABNORMAL LOW (ref 36.0–46.0)
HCT: 34.8 % — ABNORMAL LOW (ref 36.0–46.0)
Hemoglobin: 11.3 g/dL — ABNORMAL LOW (ref 12.0–15.0)
Hemoglobin: 11.8 g/dL — ABNORMAL LOW (ref 12.0–15.0)
MCH: 32.4 pg (ref 26.0–34.0)
MCHC: 33.9 g/dL (ref 30.0–36.0)
MCV: 95.6 fL (ref 78.0–100.0)
RBC: 3.49 MIL/uL — ABNORMAL LOW (ref 3.87–5.11)

## 2011-04-17 LAB — URINE CULTURE: Culture  Setup Time: 201302201338

## 2011-04-17 LAB — PROTIME-INR: Prothrombin Time: 13.5 seconds (ref 11.6–15.2)

## 2011-04-17 MED ORDER — LACTATED RINGERS IV BOLUS (SEPSIS): 1000.0000 mL | Freq: Once | INTRAVENOUS | Status: DC

## 2011-04-17 NOTE — Progress Notes (Signed)
Obstetric ultrasound performed today.  Please see full report in [facility].  Amniotic fluid volume for twin B is WNL.  Transabdominal imaging reveals a reassuring cervical appearance.    Findings and management options, including cerlage, were again discussed at length with Sheila Evans and her husband.  Risks of severe intrauterine infection and the overall uncertain outcome for this pregnancy were emphasized.  After careful consideration, patient would like to proceed with an attempt at cerclage placement.     Intrauterine pregnancy at 14 weeks 5 days Status post delivery of twin A (placenta in situ)  Patient has a clear understanding of risks, uncertain benefit, and poor prognosis for this gestation.  If cerclage will be attempted, placement at this point in time may be the most advantageous as compared to waiting longer.  In patient observation following cerclage placement for at least 7 days is recommended to allow close surveillance for the development of infection, labor, or cerclage failure.   If patient remains undelivered in 1 week with a reassuring clinical status, outpatient management could be attempted with weekly office visits and weekly assessments of cervical length by transabdominal or translabial ultrasound.  Sterile speculum exam could be attempted as indicated.  Readmission at viablity (23-24 weeks) will need to be considered, if gestation is prolonged to that point.     Patient discussed with Dr. Ernestina Penna today.

## 2011-04-17 NOTE — Progress Notes (Signed)
Met w/ pt to discuss u/s and MFM reccs. I spoke to Dr. Rachel Bo today who recc proceeding w/ cervical cerclage. Pt and husband very interested in this option. I d/w them that prognosis is poor for pre-viable PPROM and that there is no guidelines to help guide management. Pt aware risk of infection and that intrauterine infection could lead to sepsis and death. Pt aware that retained placenta of A is risk of infection. No evidence that cerclage will help as no clear cervical insufficiency. Pt aware of all surgical risks but would like to proceed. We discussed plan to watch for 1 wk in hospital then weekly as outpt. Also d/w pt unclear how we will manage her at viability and would co-manage w/ MFM.   Pt still reports no cramping, no LOF, no fevers, no pain.  After I left the room husband came out saying pt needed emergent attention. Pt still w/o cramping  But large gush of blood- 100cc blood PV. Sterile speculum exam done, small clot, no active bleeding, no placenta.  Will plan 1L LR, check coags, CBC and cont plan as previously discussed. Bleeding likely intermittent separation of placenta A. Infection, abruption, ROM B also remains in DDx.  Sheila Evans A. 04/17/2011 6:40 PM

## 2011-04-17 NOTE — Progress Notes (Signed)
Visited with pt while making rounds on unit.  Chaplain was not called initially for loss and bereavement support.  Pt is trying to remain hopeful for twin that she is still carrying and wanted to remain positive.  She did not wish to talk further at this time but is aware of availability of on-going spiritual care support.  Please page as needs arise or as pt requests. [phone]  Sheila Evans 10:26 AM   04/17/11 1000  Clinical Encounter Type  Visited With Patient  Visit Type Initial;Death

## 2011-04-17 NOTE — Progress Notes (Signed)
Nursing 1915:  Pt tolerated Dr. Roseanne Kaufman exam c/o complaints.  Linen was changed, Labs ordered, 1 liiter of LR started at 500cc/hr in [place].  Pt rested for 1.5 hrs, then ambulated to BR.  She was able to void and reported passed another clot, maybe 2.  She discribed them as golf ball size. But states no active bleeding at this time.  FHR of 148/doppler at 1930.   Fey Coghill T. Rubye Oaks RN

## 2011-04-17 NOTE — Progress Notes (Signed)
S: Pt notes mild vaginal spotting, no heavy bleeding, no cramps, no leaking fluid, no pain, no ctx, no fevers.  OCeasar Mons Vitals:   04/16/11 1651 04/16/11 2136 04/17/11 0639 04/17/11 1200  BP: 151/65 144/67 120/78 109/68  Pulse:  79 84 87  Temp: 98.4 F (36.9 C) 98.1 F (36.7 C) 98.1 F (36.7 C) 98.5 F (36.9 C)  TempSrc: Oral Oral Oral Oral  Resp: 16  18 18   Height:      Weight:      SpO2: 98% 99% 97% 98%   Gen: well appearing, slightly anxious CV: RRR Pulm: CTAB Abd: obese, NT, unable to palpate fundus but no tenderness in fundal region LE: no edema GU: def  U/s: planned today  CBC    Component Value Date/Time   WBC 14.4* 04/17/2011 0500   RBC 3.49* 04/17/2011 0500   HGB 11.3* 04/17/2011 0500   HCT 33.3* 04/17/2011 0500   PLT 245 04/17/2011 0500   MCV 95.4 04/17/2011 0500   MCH 32.4 04/17/2011 0500   MCHC 33.9 04/17/2011 0500   RDW 13.0 04/17/2011 0500   LYMPHSABS 3.0 08/13/2010 1715   MONOABS 1.0 08/13/2010 1715   EOSABS 0.3 08/13/2010 1715   BASOSABS 0.0 08/13/2010 1715    A/P: 36 yo w/ IUI di-di pregnancy, PPROM and delivery of fetus A, fetus B intact, both placentas in-utero - Pt w/o clinical evidence infection, though infection most common etiology of PPROM. Pt also w/ 18 wk PPROM in prior pregnancy. WBC elevated but stable, pt remains on Amox. Pt would like to be aggressive in management of fetus B and hopeful to continue this pregnancy. Cerclage being discussed and pt would like to proceed w/ cerclage in hopes that this would help continue pregnancy until viability. MFM consulting and I appreciate their expertise. I again d/w pt my hesitation for proceeding with a cerclage for several reasons: no clear evidence of cervical incompetence (though this a cause for PPROM, pt's cervix long on u/s), retention of placenta A is an infectious risk (esp if infection cause PPROM), placenta A may try to deliver and would not be able if cerclage in place. Pt is aware of risks of infection  that could lead to fetal demise of B, chorio, sepsis and death. Also aware of possible need to remove cerclage, bleeding risks w/ cerclage. I d/w pt that my recommendation would be to d/c home, complete course of abx, give time to see if infection or delivery of placenta A occurs and if pt remains stable w/o evidence of infection (off abx), could consider cerclage in 1 wk. I d/w pt that cerclage placement at any time carries significant risk and that outcome for viable delivery in twin preg w/ pre-viable delivery of 1 twin is poor in any event.  Cerclage is not risk-free. Pt aware. Will await MFM recommendation.   30 minutes in counseling today.  Jobanny Mavis A. 04/17/2011 1:48 PM

## 2011-04-18 ENCOUNTER — Encounter (HOSPITAL_COMMUNITY): Payer: Self-pay | Admitting: Anesthesiology

## 2011-04-18 ENCOUNTER — Inpatient Hospital Stay (HOSPITAL_COMMUNITY): Payer: 59 | Admitting: Anesthesiology

## 2011-04-18 ENCOUNTER — Encounter (HOSPITAL_COMMUNITY)
Admission: AD | Disposition: A | Discharge status: Home / Self Care | Payer: Self-pay | Source: Ambulatory Visit | Attending: Obstetrics

## 2011-04-18 ENCOUNTER — Other Ambulatory Visit: Payer: Self-pay | Admitting: Obstetrics

## 2011-04-18 HISTORY — PX: CERVICAL CERCLAGE: SHX1329

## 2011-04-18 LAB — CBC
HCT: 33 % — ABNORMAL LOW (ref 36.0–46.0)
MCV: 95.9 fL (ref 78.0–100.0)
RBC: 3.44 MIL/uL — ABNORMAL LOW (ref 3.87–5.11)
WBC: 14.6 10*3/uL — ABNORMAL HIGH (ref 4.0–10.5)

## 2011-04-18 SURGERY — CERCLAGE, CERVIX, VAGINAL APPROACH
Anesthesia: Spinal | Site: Vagina | Wound class: Clean Contaminated

## 2011-04-18 MED ORDER — METOCLOPRAMIDE HCL 5 MG/ML IJ SOLN
INTRAMUSCULAR | Status: AC
  Filled 2011-04-18: qty 2

## 2011-04-18 MED ORDER — CEFAZOLIN SODIUM 1-5 GM-% IV SOLN
INTRAVENOUS | Status: DC | PRN
  Administered 2011-04-18: 1 g via INTRAVENOUS

## 2011-04-18 MED ORDER — METOCLOPRAMIDE HCL 5 MG/ML IJ SOLN
10.0000 mg | Freq: Once | INTRAMUSCULAR | Status: AC | PRN
  Administered 2011-04-18: 10 mg via INTRAVENOUS

## 2011-04-18 MED ORDER — LIDOCAINE HCL (PF) 1 % IJ SOLN
INTRAMUSCULAR | Status: AC
  Filled 2011-04-18: qty 5

## 2011-04-18 MED ORDER — FENTANYL CITRATE 0.05 MG/ML IJ SOLN
INTRAMUSCULAR | Status: AC
  Filled 2011-04-18: qty 5

## 2011-04-18 MED ORDER — LACTATED RINGERS IV SOLN
INTRAVENOUS | Status: DC | PRN
  Administered 2011-04-18 (×2): via INTRAVENOUS

## 2011-04-18 MED ORDER — SUCCINYLCHOLINE CHLORIDE 20 MG/ML IJ SOLN
INTRAMUSCULAR | Status: AC
  Filled 2011-04-18: qty 10

## 2011-04-18 MED ORDER — FENTANYL CITRATE 0.05 MG/ML IJ SOLN
INTRAMUSCULAR | Status: AC
  Filled 2011-04-18: qty 2

## 2011-04-18 MED ORDER — BUPIVACAINE-EPINEPHRINE PF 0.25-1:200000 % IJ SOLN
INTRAMUSCULAR | Status: AC
  Filled 2011-04-18: qty 30

## 2011-04-18 MED ORDER — PROPOFOL 10 MG/ML IV EMUL
INTRAVENOUS | Status: AC
  Filled 2011-04-18: qty 20

## 2011-04-18 MED ORDER — BUPIVACAINE IN DEXTROSE 0.75-8.25 % IT SOLN
INTRATHECAL | Status: DC | PRN
  Administered 2011-04-18: 1.2 mL via INTRATHECAL

## 2011-04-18 MED ORDER — CEFAZOLIN SODIUM 1-5 GM-% IV SOLN
INTRAVENOUS | Status: AC
  Filled 2011-04-18: qty 50

## 2011-04-18 MED ORDER — MIDAZOLAM HCL 2 MG/2ML IJ SOLN
INTRAMUSCULAR | Status: AC
  Filled 2011-04-18: qty 2

## 2011-04-18 MED ORDER — LIDOCAINE HCL (CARDIAC) 20 MG/ML IV SOLN
INTRAVENOUS | Status: AC
  Filled 2011-04-18: qty 5

## 2011-04-18 MED ORDER — MEPERIDINE HCL 25 MG/ML IJ SOLN: 6.2500 mg | INTRAMUSCULAR | Status: DC | PRN

## 2011-04-18 MED ORDER — ONDANSETRON HCL 4 MG/2ML IJ SOLN
INTRAMUSCULAR | Status: AC
  Filled 2011-04-18: qty 2

## 2011-04-18 MED ORDER — KETOROLAC TROMETHAMINE 30 MG/ML IJ SOLN
INTRAMUSCULAR | Status: AC
  Filled 2011-04-18: qty 1

## 2011-04-18 MED ORDER — FENTANYL CITRATE 0.05 MG/ML IJ SOLN
25.0000 ug | INTRAMUSCULAR | Status: DC | PRN
  Administered 2011-04-18 (×2): 25 ug via INTRAVENOUS

## 2011-04-18 MED ORDER — DEXAMETHASONE SODIUM PHOSPHATE 10 MG/ML IJ SOLN
INTRAMUSCULAR | Status: AC
  Filled 2011-04-18: qty 1

## 2011-04-18 SURGICAL SUPPLY — 17 items
CATH ROBINSON RED A/P 16FR (CATHETERS) IMPLANT
CLOTH BEACON ORANGE TIMEOUT ST (SAFETY) ×2 IMPLANT
COUNTER NEEDLE 1200 MAGNETIC (NEEDLE) IMPLANT
GLOVE BIOGEL M 6.5 STRL (GLOVE) ×4 IMPLANT
GLOVE BIOGEL PI IND STRL 7.0 (GLOVE) ×1 IMPLANT
GLOVE BIOGEL PI INDICATOR 7.0 (GLOVE) ×1
GOWN PREVENTION PLUS LG XLONG (DISPOSABLE) ×4 IMPLANT
NEEDLE MAYO .5 CIRCLE (NEEDLE) IMPLANT
PACK VAGINAL MINOR WOMEN LF (CUSTOM PROCEDURE TRAY) ×2 IMPLANT
PAD PREP 24X48 CUFFED NSTRL (MISCELLANEOUS) ×2 IMPLANT
SUT ETHIBOND  5 (SUTURE)
SUT ETHIBOND 5 (SUTURE) IMPLANT
SUT PROLENE 0 CT 1 30 (SUTURE) ×2 IMPLANT
TOWEL OR 17X24 6PK STRL BLUE (TOWEL DISPOSABLE) ×4 IMPLANT
TUBING NON-CON 1/4 X 20 CONN (TUBING) ×2 IMPLANT
WATER STERILE IRR 1000ML POUR (IV SOLUTION) IMPLANT
YANKAUER SUCT BULB TIP NO VENT (SUCTIONS) ×2 IMPLANT

## 2011-04-18 NOTE — Brief Op Note (Signed)
04/15/2011 - 04/18/2011  7:21 PM  PATIENT:  Sheila Evans  36 y.o. female  PRE-OPERATIVE DIAGNOSIS:  Preterm Premature Rupture of Membranes, Twin A delivered, viable twin B, both placenta in-utero, concern for cervical insufficiency  POST-OPERATIVE DIAGNOSIS:   Preterm Premature Rupture of Membranes, Twin A delivered, viable twin B, both placenta in-utero, concern for cervical insufficiency  PROCEDURE:  Procedure(s) (LRB): CERCLAGE CERVICAL (N/A)  SURGEON:  Surgeon(s) and Role:    * Tanga Gloor A. Ernestina Penna, MD - Primary  PHYSICIAN ASSISTANT: none  ASSISTANTS: none   ANESTHESIA:   spinal  EBL:  Total I/O In: 200 [I.V.:200] Out: -   BLOOD ADMINISTERED:none  DRAINS: none   LOCAL MEDICATIONS USED:  NONE  SPECIMEN:  No Specimen  DISPOSITION OF SPECIMEN:  N/A  COUNTS:  YES  TOURNIQUET:  * No tourniquets in log *  DICTATION: .Note written in EPIC  PLAN OF CARE: Admit to inpatient   PATIENT DISPOSITION:  PACU - hemodynamically stable.   Delay start of Pharmacological VTE agent (>24hrs) due to surgical blood loss or risk of bleeding: yes

## 2011-04-18 NOTE — Anesthesia Preprocedure Evaluation (Signed)
Anesthesia Evaluation  Patient identified by MRN, date of birth, ID band Patient awake    Reviewed: Allergy & Precautions, H&P , NPO status , Patient's Chart, lab work & pertinent test results  Airway Mallampati: III TM Distance: >3 FB Neck ROM: full    Dental No notable dental hx. (+) Teeth Intact   Pulmonary neg pulmonary ROS,  clear to auscultation  Pulmonary exam normal       Cardiovascular hypertension, regular Normal    Neuro/Psych  Headaches, Negative Psych ROS   GI/Hepatic negative GI ROS, Neg liver ROS,   Endo/Other  Negative Endocrine ROS  Renal/GU negative Renal ROS  Genitourinary negative   Musculoskeletal   Abdominal Normal abdominal exam  (+)   Peds  Hematology negative hematology ROS (+)   Anesthesia Other Findings   Reproductive/Obstetrics (+) Pregnancy                           Anesthesia Physical Anesthesia Plan  ASA: III  Anesthesia Plan: Spinal   Post-op Pain Management:    Induction:   Airway Management Planned:   Additional Equipment:   Intra-op Plan:   Post-operative Plan:   Informed Consent: I have reviewed the patients History and Physical, chart, labs and discussed the procedure including the risks, benefits and alternatives for the proposed anesthesia with the patient or authorized representative who has indicated his/her understanding and acceptance.     Plan Discussed with: Anesthesiologist and Surgeon  Anesthesia Plan Comments:         Anesthesia Quick Evaluation

## 2011-04-18 NOTE — Op Note (Signed)
OPERATIVE REPORT   PREOPERATIVE DIAGNOSES:  1. Concern of cervical incompetence.  2. Intrauterine gestation, at 14'6 weeks, s/p delivery of twin A, no evidence infection  POSTOPERATIVE DIAGNOSES:  1. Cervical incompetence.  2. Intrauterine gestation at 14 weeks.   PROCEDURE:  McDonald cervical cerclage.   ANESTHESIA: Spinal.   SURGEON: Lendon Colonel, M.D.  ASSISTANT: none Antibiotics: 1 g Ancef EBL: 200cc Complications: none Findings: cvx 2cm in length, finger tip dilation, soft, membranes at os, likely from sac/ placenta of A, trimmed, moderate bleeding 200 cc with manipulation and trimming of this membrane   INDICATION: This is a 35yo obese, G3P0020 at 14'6  with findings of PPROM and delivery of twin A, intact twin B, intact placenta x 2 in the setting of prior pregnancy loss secondary to PPROM at 18 wks. Pt understands the high risks and unclear benefits of a cerclage in this setting. No clear documented cervical incompetence. History of LEEP. After review of the sonogram, discussiion with MFM and pt that this is a high risk situation, pt opts for a cerclage.  Options include cerclage which may prevent further cervical dilation and ROM of twin B but also w/ operative risks including bleeding, infection, ROM and longer term risks including further damage to cervix if pt contracts w/ cerclage, chorio and PT loss, esp as cerclage may be nidus for infection. Alternative option is to bedrest and expectant management. Pt aware she will need in-house monitoring for about 1 wk and long term bedrest with a cerclage. The patient was consented for a cerclage.  PROCEDURE IN DETAIL: Under adequate spinal anesthesia, the patient was  placed in the dorsal lithotomy position. She was externally sterilely  prepped and draped in usual fashion. Bladder was catheterized for small amount of urine. Weighted speculum placed in the  vagina. Sims retractor was placed anteriorly. The visualization of the    cervix shows 2cm in length but only 1 cm free of bladder .Pt was placed in deep Trendelenberg position. The cervix was grasped with a ring clamp anteriorly and this moved around the cervix during the procedure to help w/ countertraction. No exposed membranes were noted but after placing the 2nd pass of suture a small amount of fetal membranes, assumed from placenta of fetus A came into the cervix. No cord segment from A followed and decision made to trim this membrane. A ring was placed on this membrane and gentle traction given in order to remove as much membrane as possible. At this time, brisk bleeding was noted, coming from the uterus, not the surface of the cervix. Several minutes passed, 200cc blood noted and bleeding eventually stopped on its own. The last 2 sutures were then placed.    An Ethibond suture on 1/2 circle Mayo needle was used to perform the McDonald cerclage, starting at [employer]. Four pursestring suture were thrown and tied at 12 o'clock. A prolene tag was used under the Ethibond suture to help with identification later in the pregnancy. Before the Prolene was tied, it was inadvertently pulled from under the first knot of Ethibond. Five knots of Ethibond were thrown, then the Prolene was used under 2 additional knots as a tag.  Good closure of the cervix was noted. Hemostasis was noted. At that point, the procedure was felt to  be complete.The cervix on digital exam post procedure was closed.   Sponge lap and needle counts were correct.  Amahia Madonia A. 04/18/2011 7:24 PM

## 2011-04-18 NOTE — Anesthesia Procedure Notes (Signed)
Spinal  Patient location during procedure: OR Start time: 04/18/2011 6:31 PM Staffing Anesthesiologist: Jassica Zazueta A. Performed by: anesthesiologist  Preanesthetic Checklist Completed: patient identified, site marked, surgical consent, pre-op evaluation, timeout performed, IV checked, risks and benefits discussed and monitors and equipment checked Spinal Block Patient position: sitting Prep: site prepped and draped and DuraPrep Patient monitoring: heart rate, cardiac monitor, continuous pulse ox and blood pressure Approach: midline Location: L3-4 Injection technique: single-shot Needle Needle type: Sprotte  Needle gauge: 24 G Needle length: 9 cm Needle insertion depth: 7 cm Assessment Sensory level: T8 Additional Notes Patient tolerated procedure well. Adequate sensory level.

## 2011-04-18 NOTE — Progress Notes (Signed)
Dr Fogleman at bedside.  

## 2011-04-18 NOTE — Anesthesia Postprocedure Evaluation (Signed)
Anesthesia Post Note  Patient: Sheila Evans  Procedure(s) Performed: Procedure(s) (LRB): CERCLAGE CERVICAL (N/A)  Anesthesia type: Spinal  Patient location: PACU  Post pain: Pain level controlled  Post assessment: Post-op Vital signs reviewed  Last Vitals:  Filed Vitals:   04/18/11 1925  BP:   Pulse: 82  Temp: 36.9 C  Resp: 21    Post vital signs: Reviewed  Level of consciousness: awake  Complications: No apparent anesthesia complications

## 2011-04-18 NOTE — Transfer of Care (Signed)
Immediate Anesthesia Transfer of Care Note  Patient: Sheila Evans  Procedure(s) Performed: Procedure(s) (LRB): CERCLAGE CERVICAL (N/A)  Patient Location: PACU  Anesthesia Type: Spinal  Level of Consciousness: awake, alert  and oriented  Airway & Oxygen Therapy: Patient Spontanous Breathing  Post-op Assessment: Report given to PACU RN and Post -op Vital signs reviewed and stable  Post vital signs: Reviewed and stable  Complications: No apparent anesthesia complications

## 2011-04-18 NOTE — Progress Notes (Signed)
Checked in with pt today for follow-up care and she declined a visit at this time.  Please page as needed or as pt requests. [phone]  Agnes Lawrence Linsey Hirota 1:21 PM   04/18/11 1300  Clinical Encounter Type  Visited With Patient  Visit Type Follow-up

## 2011-04-18 NOTE — H&P (Signed)
Day of surgery H&P update  Pt notes no further bleeding today, no cramping, no LOF. No fevers, no abd pain. Pt notes stress HA and feeling hungry as NPO today for planned surgery  Filed Vitals:   04/17/11 2144 04/18/11 0535 04/18/11 1200 04/18/11 1710  BP: 123/79 103/65 118/75 114/63  Pulse: 99 86 83 89  Temp: 98.6 F (37 C) 98.4 F (36.9 C) 98.4 F (36.9 C) 98.4 F (36.9 C)  TempSrc: Oral Oral Oral Oral  Resp: 18 18 18 18   Height:      Weight:      SpO2: 96% 99% 98% 97%   FH at 3 p Gen: obese, slightly anxious, non-toxic Abd: obese, NT GU: def to OR LE: no edema, SCD in place  CBC    Component Value Date/Time   WBC 14.6* 04/18/2011 0510   RBC 3.44* 04/18/2011 0510   HGB 11.2* 04/18/2011 0510   HCT 33.0* 04/18/2011 0510   PLT 258 04/18/2011 0510   MCV 95.9 04/18/2011 0510   MCH 32.6 04/18/2011 0510   MCHC 33.9 04/18/2011 0510   RDW 13.0 04/18/2011 0510   LYMPHSABS 3.0 08/13/2010 1715   MONOABS 1.0 08/13/2010 1715   EOSABS 0.3 08/13/2010 1715   BASOSABS 0.0 08/13/2010 1715     A/P: G3P0020 at 14'6 w/ PPROM and previable delivery of baby A, now w/ no evidence of infection, viable/ intact fetus B for cervical cerclage. - pt aware of risks of cerclage- bleeding, infection, sepsis, death, inability to prevent changes to cervix, potential to cause increased damage to cervix, inability to help baby B to get to viability, causing ROM of B. Very little data to support cerclage in this situation and it is not clear that benefits outweigh risks.  Pt has had multiple discussions and would like to proceed. MFM in agreement and have also discussed risks with patient.   Renaud Celli A. 04/18/2011 6:09 PM

## 2011-04-18 NOTE — Progress Notes (Signed)
Left forearm NSL d/c'd with catheter intact.  Site slightly swollen but otherwise unremarkable.  Dsg applied.

## 2011-04-19 LAB — CBC
MCV: 94.8 fL (ref 78.0–100.0)
Platelets: 259 10*3/uL (ref 150–400)
RBC: 3.45 MIL/uL — ABNORMAL LOW (ref 3.87–5.11)
RDW: 12.7 % (ref 11.5–15.5)
WBC: 14.7 10*3/uL — ABNORMAL HIGH (ref 4.0–10.5)

## 2011-04-19 NOTE — Progress Notes (Signed)
Pt notes scant bleeding last night, down to spotting this am. No pad use. Pt notes lower/ suprapubic cramps last pm after OR, none through the night, none now.  No abd pain. Nl BM this am. Voiding w/o difficulty, no blood in urine. No fevers.  OCeasar Mons Vitals:   04/18/11 2344 04/19/11 0120 04/19/11 0541 04/19/11 1000  BP: 115/70 127/71 103/58 131/69  Pulse: 91 100 84 95  Temp: 98 F (36.7 C) 98.7 F (37.1 C) 98.4 F (36.9 C) 98.5 F (36.9 C)  TempSrc: Oral Oral Oral Oral  Resp: 16 16 16 18   Height:      Weight:      SpO2: 99% 94% 98% 98%   Gen: obese, lying in bed, no distress CV: RRR Pulm: CTAB Abd: obese, NT GU: no blood at perineum, remainder of exam deferred LE: NT, SCDs in place  CBC    Component Value Date/Time   WBC 14.7* 04/19/2011 0510   RBC 3.45* 04/19/2011 0510   HGB 11.1* 04/19/2011 0510   HCT 32.7* 04/19/2011 0510   PLT 259 04/19/2011 0510   MCV 94.8 04/19/2011 0510   MCH 32.2 04/19/2011 0510   MCHC 33.9 04/19/2011 0510   RDW 12.7 04/19/2011 0510   LYMPHSABS 3.0 08/13/2010 1715   MONOABS 1.0 08/13/2010 1715   EOSABS 0.3 08/13/2010 1715   BASOSABS 0.0 08/13/2010 1715     A/P: 15'0 s/p cerclage placement last pm after PPROM and pre-viable delivery twin A - No change in pt's status, no evidence for infection, day 6/7 of amox, tomorrow will be last dose, cont to monitor for signs of infection, delivery of placenta A (will be difficult now w/ cerclage in place), abruption, ROM baby B. - will have MFM see pt next wk - plan in house for 1 wk then d/c home w/ weeky f/u between [facility] and MFM - f/u TSH in 6 wks  Brandi Armato A. 04/19/2011 12:15 PM

## 2011-04-20 LAB — CBC
HCT: 32.4 % — ABNORMAL LOW (ref 36.0–46.0)
Hemoglobin: 10.8 g/dL — ABNORMAL LOW (ref 12.0–15.0)
WBC: 12.9 10*3/uL — ABNORMAL HIGH (ref 4.0–10.5)

## 2011-04-20 NOTE — Progress Notes (Signed)
Pt notes no bleeding, no cramping, no fevers, No abd pain. Nl BM. Voiding w/o difficulty.  OCeasar Mons Vitals:   04/19/11 1800 04/19/11 2143 04/20/11 0605 04/20/11 1200  BP: 123/70 125/78 121/74 125/74  Pulse: 86 81 82 93  Temp: 98.3 F (36.8 C) 97.6 F (36.4 C) 98.1 F (36.7 C) 98.8 F (37.1 C)  TempSrc: Oral Oral Oral Oral  Resp: 20 18 18 18   Height:      Weight:      SpO2: 98% 97% 96% 98%                                                            Gen: obese, lying in bed, no distress  CV: RRR  Pulm: CTAB  Abd: obese, NT  LE: NT, SCDs in place    A/P: 15'1 s/p cerclage placemen after PPROM and pre-viable delivery twin A  - No change in pt's status, no evidence for infection, day 7/7 of amox, cont to monitor for signs of infection, delivery of placenta A (will be difficult now w/ cerclage in place), abruption, ROM baby B.  - will have MFM see pt next wk  - plan in house for 1 wk then d/c home w/ weeky f/u between [facility] and MFM  - f/u TSH in 6 wks  Eldred Sooy A. 04/20/2011 3:48 PM

## 2011-04-21 ENCOUNTER — Encounter (HOSPITAL_COMMUNITY): Payer: Self-pay | Admitting: Obstetrics

## 2011-04-21 ENCOUNTER — Ambulatory Visit (HOSPITAL_COMMUNITY): Admission: RE | Admit: 2011-04-21 | Payer: 59 | Source: Ambulatory Visit

## 2011-04-21 LAB — CBC
Hemoglobin: 10.5 g/dL — ABNORMAL LOW (ref 12.0–15.0)
MCH: 32.8 pg (ref 26.0–34.0)
MCV: 95.9 fL (ref 78.0–100.0)
RBC: 3.2 MIL/uL — ABNORMAL LOW (ref 3.87–5.11)

## 2011-04-21 NOTE — Progress Notes (Signed)
S: Pt notes no bleeding at all, no cramping, no fevers. + void, nl BM, nl appetite. Enjoyed wheelchair outside yest. Pt has questions about progesterone and prolonged antibiotic use.  O: Filed Vitals:   04/20/11 1800 04/20/11 2136 04/21/11 0618 04/21/11 1200  BP: 138/86 136/83 108/72 112/73  Pulse: 101 95 90 86  Temp: 98.6 F (37 C) 97.9 F (36.6 C) 97.8 F (36.6 C) 98.6 F (37 C)  TempSrc: Oral Oral Oral Oral  Resp: 20 18 18 18   Height:      Weight:   121.762 kg (268 lb 7 oz)   SpO2: 98% 99% 97%   Gen: obese, laying in bed, no distress Abd: obese, NT GU: def LE: no edema, NT  CBC    Component Value Date/Time   WBC 14.7* 04/21/2011 0525   RBC 3.20* 04/21/2011 0525   HGB 10.5* 04/21/2011 0525   HCT 30.7* 04/21/2011 0525   PLT 248 04/21/2011 0525   MCV 95.9 04/21/2011 0525   MCH 32.8 04/21/2011 0525   MCHC 34.2 04/21/2011 0525   RDW 12.9 04/21/2011 0525   LYMPHSABS 3.0 08/13/2010 1715   MONOABS 1.0 08/13/2010 1715   EOSABS 0.3 08/13/2010 1715   BASOSABS 0.0 08/13/2010 1715      A/P: 15'2 s/p cerclage placement after PPROM and pre-viable delivery twin A  - No change in pt's status, no evidence for infection, s/p p7d amox/ 1 d azithor, cont to monitor for signs of infection, delivery of placenta A (will be difficult now w/ cerclage in place), abruption, ROM baby B.  - will have MFM see pt mid this wk - plan in house for 1 wk then d/c home w/ weeky f/u between [facility] and MFM  - f/u TSH in 6 wks  Corina Stacy A. 04/21/2011 2:14 PM

## 2011-04-21 NOTE — Progress Notes (Signed)
This was a follow-up visit with pt.  Last week pt had not been interested in talking.  Today she was in better spirits and was more receptive to the visit.  She is trying to stay positive for baby she is carrying and is not ready to talk about her loss because talking about it brings her to a very negative place.  She is anxious to be in her own home again but knows that what she is doing is an act of love for her baby.  Pt asked to be kept in my prayers.  I will continue to pray for her, but will not check on her unless she requests a visit (per her directions).  Please page as pt requests.  [phone]  Chaplain Dyanne Carrel 10:56 AM   04/21/11 1000  Clinical Encounter Type  Visited With Patient  Visit Type Follow-up  Spiritual Encounters  Spiritual Needs Other (Comment) (Pt is trying to stay positive for her baby she is carrying.)  Stress Factors  Patient Stress Factors Loss  Family Stress Factors Other (Comment) (Fear of losing second baby.)

## 2011-04-21 NOTE — Progress Notes (Signed)
UR chart review completed.  

## 2011-04-22 LAB — CBC
HCT: 32 % — ABNORMAL LOW (ref 36.0–46.0)
MCHC: 33.4 g/dL (ref 30.0–36.0)
MCV: 95.8 fL (ref 78.0–100.0)
RDW: 12.9 % (ref 11.5–15.5)

## 2011-04-22 NOTE — Progress Notes (Signed)
S: Pt notes no vaginal bleeding, no LOF, no contractions, no abdominal pain. tol reg po. No pain.  OCeasar Mons Vitals:   04/21/11 2201 04/22/11 0638 04/22/11 0650 04/22/11 1200  BP: 142/82 141/82  126/80  Pulse: 88 84  90  Temp: 98.2 F (36.8 C) 97.4 F (36.3 C)  98.8 F (37.1 C)  TempSrc: Oral Oral  Oral  Resp: 18 18  18   Height:      Weight:   121.224 kg (267 lb 4 oz)   SpO2: 100% 99%  98%    Gen: obese, lying in bed CV: RRR Pulm: CTAB Abd: obese, NT GU: def LE: NT, no edema  CBC    Component Value Date/Time   WBC 15.8* 04/22/2011 0605   RBC 3.34* 04/22/2011 0605   HGB 10.7* 04/22/2011 0605   HCT 32.0* 04/22/2011 0605   PLT 265 04/22/2011 0605   MCV 95.8 04/22/2011 0605   MCH 32.0 04/22/2011 0605   MCHC 33.4 04/22/2011 0605   RDW 12.9 04/22/2011 0605   LYMPHSABS 3.0 08/13/2010 1715   MONOABS 1.0 08/13/2010 1715   EOSABS 0.3 08/13/2010 1715   BASOSABS 0.0 08/13/2010 1715   A/P: 15'3 s/p cerclage placement after PPROM and pre-viable delivery twin A  - No change in pt's status, no evidence for infection though leukocytossis noted, s/p p7d amox/ 1 d azithor, cont to monitor for signs of infection, delivery of placenta A (will be difficult now w/ cerclage in place), abruption, ROM baby B.  - will have MFM see pt tomorrow. Plan cervical length. Appreciate MFM reccs in terms of bedrest, need for hospital stay, antibiotics, progesterone. Will also want MFM to be available by phone at all times (nights/ weekends) to help with any management decisions, especially if there are concerns about cerclage removal and/or delivery. Will check w/ MFM for contact numbers on off hours to help primary OB team. Pt aware on call MD for [facility] will be involved in these decisions if I am not available. Pt agrees.  - plan in house for 1 wk after cerclage then d/c home w/ weeky f/u between [facility] and MFM  - f/u TSH in 6 wks - bp noted, no prior diagnosis of hypertension but pt's habitus certainly  puts her at risk. Will check baseline 24 hr urine.    Alireza Pollack A. 04/22/2011 6:03 PM

## 2011-04-23 ENCOUNTER — Inpatient Hospital Stay (HOSPITAL_COMMUNITY): Payer: 59

## 2011-04-23 LAB — CBC
MCH: 32.2 pg (ref 26.0–34.0)
MCHC: 33.7 g/dL (ref 30.0–36.0)
Platelets: 261 10*3/uL (ref 150–400)

## 2011-04-23 NOTE — Progress Notes (Signed)
Patient ID: Sheila Evans, female   DOB: Sep 17, 1975, 36 y.o.   MRN: [id number] Subjective: Feels well, passed small blood clot earlier, denies foul vaginal discharge, pelvic/ abdo cramps or pain or tenderness. No LE pain or swelling. No nausea/vomiting.   Objective: Vital signs in last 24 hours: Temp:  [97.7 F (36.5 C)-98.7 F (37.1 C)] 98.2 F (36.8 C), afebrile Pulse Rate:  [88-98] 98   Resp:  [18-20] 18  BP: (94-141)/(59-85) 140/83 mmHg  SpO2:  [96 %-98 %] 96 %   Physical exam:  A&O x 3, no acute distress Abdo soft, non tender, non acute, no uterine tenderness Extr no edema/ tenderness Pelvic deferred. FHT present  Lab Results: Stable WBC count. 24 hr urine protein (baseline) ongoing.   Assessment/Plan: LOS: 8 days  Twin gestation with twin A PROM/ spont.delivery with retained placenta, suspected incompetent cervix, s/p rescue cerclage.  Slightly elevated WBC count but stable with no evidence of infection at present. No evidence of PTL/cramps Sono today to assess cervical length since cerclage (04/18/11) Borderline elevated BPs, 24 hr urine ongoing for baseline Obesity and pre-DM A1C per pt before pregnancy. Plan early glucola once current situation stabilize/  MFM on consult.  Pt understands risks, especially of sepsis, death, hysterectomy and cervical laceration if PTL while cerclage in place and many other risks she is extensively counseled on.    Sheila Evans R 04/23/2011, 1:46 PM

## 2011-04-24 LAB — CREATININE CLEARANCE, URINE, 24 HOUR
Collection Interval-CRCL: 24 hours
Creatinine Clearance: 293 mL/min — ABNORMAL HIGH (ref 75–115)
Creatinine, 24H Ur: 2024 mg/d — ABNORMAL HIGH (ref 700–1800)
Creatinine, Urine: 112.44 mg/dL
Creatinine: 0.48 mg/dL — ABNORMAL LOW (ref 0.50–1.10)
Urine Total Volume-CRCL: 1800 mL

## 2011-04-24 LAB — PROTEIN, URINE, 24 HOUR
Collection Interval-UPROT: 24 hours
Protein, Urine: 7 mg/dL

## 2011-04-24 LAB — CBC
MCH: 32.1 pg (ref 26.0–34.0)
Platelets: 263 10*3/uL (ref 150–400)
RBC: 3.36 MIL/uL — ABNORMAL LOW (ref 3.87–5.11)
RDW: 13.1 % (ref 11.5–15.5)
WBC: 15.2 10*3/uL — ABNORMAL HIGH (ref 4.0–10.5)

## 2011-04-24 LAB — CREATININE, SERUM: Creatinine, Ser: 0.48 mg/dL — ABNORMAL LOW (ref 0.50–1.10)

## 2011-04-24 NOTE — Progress Notes (Signed)
MFM Note  Had lengthy discussion with patient and her husband yesterday. Sheila Evans is very aware of and accepts the risks of continuing the pregnancy.  Assessment: 1) S/P delivery of twin A at 14+ weeks following PPROM; twin B undelivered 2) S/P cerclage x 6 days 3) No s/s of intrauterine infection; off antibiotics x 4 days 4) Previous PPROM and delivery at 18 weeks 5) Obesity  Recommendations: 1) As per Dr. Carmin Muskrat notes 2) Agree with discharge tomorrow (7days post cerclage) 3) Modified bedrest at home 4) Follow-up US in one week at [facility]  - obtain appt prior to dc 5) With any clinical s/s infection, remove cerclage and deliver 6) Progesterone not recommended  For our 24 hour coverage, please call antenatal floor or go to [email or web address]. My cell number is [phone].

## 2011-04-24 NOTE — Progress Notes (Signed)
Pt notes no bleeding, no fevers, no cramping, no LOF. Had reassuring u/s w/ MFM yest and cvx long/ closed, no funnelling, cerclage 1.3 cm from os.   PE: Filed Vitals:   04/23/11 1800 04/23/11 2210 04/24/11 0522 04/24/11 1200  BP: 120/77 124/84 96/66 128/85  Pulse: 87 87 91 101  Temp: 99 F (37.2 C) 98.3 F (36.8 C) 97.5 F (36.4 C) 98.4 F (36.9 C)  TempSrc: Oral Oral Oral Oral  Resp: 20 18 18 18   Height:      Weight:      SpO2: 98% 98% 98% 98%   Gen: well appearing, positive CV: RRR Pulm: CTAB Abd: obese, NT GU: def LE: NT, no edema  CBC    Component Value Date/Time   WBC 15.2* 04/24/2011 0520   RBC 3.36* 04/24/2011 0520   HGB 10.8* 04/24/2011 0520   HCT 32.3* 04/24/2011 0520   PLT 263 04/24/2011 0520   MCV 96.1 04/24/2011 0520   MCH 32.1 04/24/2011 0520   MCHC 33.4 04/24/2011 0520   RDW 13.1 04/24/2011 0520   LYMPHSABS 3.0 08/13/2010 1715   MONOABS 1.0 08/13/2010 1715   EOSABS 0.3 08/13/2010 1715   BASOSABS 0.0 08/13/2010 1715   A/P: 15'5 s/p cerclage placement after PPROM and pre-viable delivery twin A  - No change in pt's status, no evidence for infection though leukocytossis noted, s/p p7d amox/ 1 d azithor, cont to monitor for signs of infection, delivery of placenta A (will be difficult now w/ cerclage in place), abruption, ROM baby B.  -MFM involved in care, nl u/s yest and plan for f/u visit and u/s w/ MFM next wk. Appreciate MFM reccs in terms of bedrest, need for hospital stay, antibiotics, progesterone. Will also want MFM to be available by phone at all times (nights/ weekends) to help with any management decisions, especially if there are concerns about cerclage removal and/or delivery. Will check w/ MFM for contact numbers on off hours to help primary OB team. Pt aware on call MD for [facility] will be involved in these decisions if I am not available. Pt agrees.  Kennon Portela d/c home tomorrow- plan weeky f/u between [facility] and MFM  - f/u TSH in 6 wks  - bp noted,  no prior diagnosis of hypertension but pt's habitus certainly puts her at risk. 24 hr protein result pedning - plan early GTT at f/u in office. Will defer now due to recent stresses. - start bed PT, d/w pt good diet to avoid excessive wt gain, esp as pt will not be active. We discussed no good evidence to suggest bed rest (esp if cvx remains closed) but that if pt was not on bed rest and had cervical changes and poor outcome she may have have regret and would feel like there was more she couldv'e done.  Kimm Ungaro A. 04/24/2011 4:15 PM    Trevino Wyatt A. 04/24/2011 4:12 PM

## 2011-04-24 NOTE — Progress Notes (Signed)
UR Chart review completed.  

## 2011-04-25 ENCOUNTER — Other Ambulatory Visit (HOSPITAL_COMMUNITY): Payer: Self-pay | Admitting: Obstetrics

## 2011-04-25 DIAGNOSIS — O30009 Twin pregnancy, unspecified number of placenta and unspecified number of amniotic sacs, unspecified trimester: Secondary | ICD-10-CM

## 2011-04-25 DIAGNOSIS — O3440 Maternal care for other abnormalities of cervix, unspecified trimester: Secondary | ICD-10-CM

## 2011-04-25 DIAGNOSIS — O09299 Supervision of pregnancy with other poor reproductive or obstetric history, unspecified trimester: Secondary | ICD-10-CM

## 2011-04-25 DIAGNOSIS — E079 Disorder of thyroid, unspecified: Secondary | ICD-10-CM

## 2011-04-25 LAB — CBC
MCH: 32.5 pg (ref 26.0–34.0)
MCHC: 34.3 g/dL (ref 30.0–36.0)
MCV: 94.9 fL (ref 78.0–100.0)
Platelets: 261 10*3/uL (ref 150–400)
RDW: 12.9 % (ref 11.5–15.5)
WBC: 15.8 10*3/uL — ABNORMAL HIGH (ref 4.0–10.5)

## 2011-04-25 MED ORDER — LEVOTHYROXINE SODIUM 137 MCG PO TABS: 137.0000 ug | ORAL_TABLET | Freq: Every day | ORAL | Status: DC

## 2011-04-25 NOTE — Discharge Summary (Signed)
D/C summary  Admission for PPROM and delivery of fetus A. Pt remained stable post-delivery of fetus A w/ only a few episodes of bleeding, no LOF of fetus B, stable WBC, no labor, no signs of infection, no temps. After very careful consideration of options, review of her history and fully informed consent about risks of pregnancy loss, infections, further cervical damage, bleeding, sepsis, death along with reviewing with the patient that there is no good evidence to guide decisions, we proceeded to place a MacDonald cerclage at 14'6. Pt did well intraop and post-op has continued to show no evidence of infection, no further bleeding and f/u ultrasound has shown cervix to be long/ closed w/ no funnelling. Intact baby B and placenta of A remains in-utero.  D/C dx: delivery of twin A, cervical incompetence Procedures: cervical cerclage D/c condition: stable D/c meds: PNV, iron, colace, synthroid D/c instruction: f/u for si/sx of infection, f/u weekly outpt visits  Sheila Evans A. 04/25/2011 3:05 PM

## 2011-04-25 NOTE — Progress Notes (Signed)
Pt is currently active in the Healthy Pregnancy Program as a benefit of the [employer] insurance plan. She will receive a post d/c transition of care call and follow up phone calls to assess her medical/prenatal issues. Will follow up post d/c. Kyre Jeffries C. Roena Malady, RN, CCM, [facility] Care Management program, # [phone].

## 2011-04-25 NOTE — Progress Notes (Signed)
S: pt notes no bleeding at all, no LOF, no fevers, no abdominal pain.  O:  Filed Vitals:   04/24/11 1200 04/24/11 1800 04/24/11 2200 04/25/11 0900  BP: 128/85 130/81 138/83 118/75  Pulse: 101 90 80 94  Temp: 98.4 F (36.9 C) 98.2 F (36.8 C) 97.8 F (36.6 C) 97.7 F (36.5 C)  TempSrc: Oral Oral Oral Oral  Resp: 18 20 18 16   Height:      Weight:      SpO2: 98% 97% 98%    Gen: well appearing, no distress, upbeat mood Abd: obese, gravid, unable to palpate fundus, NT LE: no edema  CBC    Component Value Date/Time   WBC 15.8* 04/25/2011 0525   RBC 3.32* 04/25/2011 0525   HGB 10.8* 04/25/2011 0525   HCT 31.5* 04/25/2011 0525   PLT 261 04/25/2011 0525   MCV 94.9 04/25/2011 0525   MCH 32.5 04/25/2011 0525   MCHC 34.3 04/25/2011 0525   RDW 12.9 04/25/2011 0525   LYMPHSABS 3.0 08/13/2010 1715   MONOABS 1.0 08/13/2010 1715   EOSABS 0.3 08/13/2010 1715   BASOSABS 0.0 08/13/2010 1715     A/P: 15'6 s/p cerclage placement after PPROM and pre-viable delivery twin A  - No change in pt's status, no evidence for infection though leukocytossis noted, s/p p7d amox/ 1 d azithor, cont to monitor for signs of infection, delivery of placenta A (will be difficult now w/ cerclage in place), abruption, ROM baby B.  -MFM involved in care, nl u/s 2/27 and plan for f/u visit and u/s w/ MFM next wk Will plan weekly assessment of cervical length. Appreciate MFM reccs. No further antibiotics at this time, no evidence for progesterone. MFM will be available by phone at all times (nights/ weekends- [email or web address], password: [email or web address]) to help with any management decisions, especially if there are concerns about cerclage removal and/or delivery.  Pt aware on call MD for [facility] will be involved in these decisions if I am not available. Pt agrees.  - d/c home today, pt to take temp bid and understands signs of infection and need to be seen for these.  plan weeky f/u between [facility] and MFM  - f/u TSH in 6 wks  - bp noted, no  prior diagnosis of hypertension but pt's habitus certainly puts her at risk. 24 hr protein result normal. - plan early GTT at f/u in office. Will defer now due to recent stresses.  - start bed PT, d/w pt good diet to avoid excessive wt gain, esp as pt will not be active. We discussed no good evidence to suggest bed rest (esp if cvx remains closed) but that if pt was not on bed rest and had cervical changes and poor outcome she may have have regret and would feel like there was more she couldv'e done.  Radek Carnero A. 04/25/2011 2:58 PM

## 2011-05-01 ENCOUNTER — Ambulatory Visit (HOSPITAL_COMMUNITY)
Admission: RE | Admit: 2011-05-01 | Discharge: 2011-05-01 | Disposition: A | Discharge status: Home / Self Care | Payer: 59 | Source: Ambulatory Visit | Attending: Obstetrics | Admitting: Obstetrics

## 2011-05-01 DIAGNOSIS — O30009 Twin pregnancy, unspecified number of placenta and unspecified number of amniotic sacs, unspecified trimester: Secondary | ICD-10-CM | POA: Insufficient documentation

## 2011-05-01 DIAGNOSIS — E079 Disorder of thyroid, unspecified: Secondary | ICD-10-CM | POA: Insufficient documentation

## 2011-05-01 DIAGNOSIS — E039 Hypothyroidism, unspecified: Secondary | ICD-10-CM | POA: Insufficient documentation

## 2011-05-01 DIAGNOSIS — O3440 Maternal care for other abnormalities of cervix, unspecified trimester: Secondary | ICD-10-CM | POA: Insufficient documentation

## 2011-05-01 DIAGNOSIS — O99280 Endocrine, nutritional and metabolic diseases complicating pregnancy, unspecified trimester: Secondary | ICD-10-CM

## 2011-05-01 DIAGNOSIS — O09299 Supervision of pregnancy with other poor reproductive or obstetric history, unspecified trimester: Secondary | ICD-10-CM | POA: Insufficient documentation

## 2011-05-01 LAB — CBC
Hemoglobin: 11.7 g/dL — ABNORMAL LOW (ref 12.0–15.0)
MCHC: 33.8 g/dL (ref 30.0–36.0)
Platelets: 299 10*3/uL (ref 150–400)
RDW: 13.2 % (ref 11.5–15.5)

## 2011-05-01 LAB — DIFFERENTIAL
Basophils Absolute: 0 10*3/uL (ref 0.0–0.1)
Basophils Relative: 0 % (ref 0–1)
Neutro Abs: 10.5 10*3/uL — ABNORMAL HIGH (ref 1.7–7.7)
Neutrophils Relative %: 76 % (ref 43–77)

## 2011-05-13 ENCOUNTER — Other Ambulatory Visit (HOSPITAL_COMMUNITY): Payer: Self-pay | Admitting: Obstetrics

## 2011-05-13 DIAGNOSIS — O09299 Supervision of pregnancy with other poor reproductive or obstetric history, unspecified trimester: Secondary | ICD-10-CM

## 2011-05-13 DIAGNOSIS — O99280 Endocrine, nutritional and metabolic diseases complicating pregnancy, unspecified trimester: Secondary | ICD-10-CM

## 2011-05-13 DIAGNOSIS — Z0489 Encounter for examination and observation for other specified reasons: Secondary | ICD-10-CM

## 2011-05-13 DIAGNOSIS — O3440 Maternal care for other abnormalities of cervix, unspecified trimester: Secondary | ICD-10-CM

## 2011-05-15 ENCOUNTER — Ambulatory Visit (HOSPITAL_COMMUNITY)
Admission: RE | Admit: 2011-05-15 | Discharge: 2011-05-15 | Disposition: A | Discharge status: Home / Self Care | Payer: 59 | Source: Ambulatory Visit | Attending: Obstetrics | Admitting: Obstetrics

## 2011-05-15 VITALS — BP 121/68 | HR 100 | Wt 266.0 lb

## 2011-05-15 DIAGNOSIS — O99810 Abnormal glucose complicating pregnancy: Secondary | ICD-10-CM | POA: Insufficient documentation

## 2011-05-15 DIAGNOSIS — E079 Disorder of thyroid, unspecified: Secondary | ICD-10-CM

## 2011-05-15 DIAGNOSIS — O09299 Supervision of pregnancy with other poor reproductive or obstetric history, unspecified trimester: Secondary | ICD-10-CM

## 2011-05-15 DIAGNOSIS — O358XX0 Maternal care for other (suspected) fetal abnormality and damage, not applicable or unspecified: Secondary | ICD-10-CM | POA: Insufficient documentation

## 2011-05-15 DIAGNOSIS — Z363 Encounter for antenatal screening for malformations: Secondary | ICD-10-CM | POA: Insufficient documentation

## 2011-05-15 DIAGNOSIS — Z1389 Encounter for screening for other disorder: Secondary | ICD-10-CM | POA: Insufficient documentation

## 2011-05-15 DIAGNOSIS — Z0489 Encounter for examination and observation for other specified reasons: Secondary | ICD-10-CM

## 2011-05-15 DIAGNOSIS — O3440 Maternal care for other abnormalities of cervix, unspecified trimester: Secondary | ICD-10-CM | POA: Insufficient documentation

## 2011-05-15 DIAGNOSIS — O3430 Maternal care for cervical incompetence, unspecified trimester: Secondary | ICD-10-CM

## 2011-05-15 DIAGNOSIS — O30009 Twin pregnancy, unspecified number of placenta and unspecified number of amniotic sacs, unspecified trimester: Secondary | ICD-10-CM | POA: Insufficient documentation

## 2011-05-29 ENCOUNTER — Encounter: Payer: 59 | Attending: Obstetrics | Admitting: Dietician

## 2011-05-29 ENCOUNTER — Other Ambulatory Visit (HOSPITAL_COMMUNITY): Payer: Self-pay | Admitting: Maternal and Fetal Medicine

## 2011-05-29 ENCOUNTER — Ambulatory Visit (HOSPITAL_COMMUNITY)
Admission: RE | Admit: 2011-05-29 | Discharge: 2011-05-29 | Disposition: A | Discharge status: Home / Self Care | Payer: 59 | Source: Ambulatory Visit | Attending: Obstetrics | Admitting: Obstetrics

## 2011-05-29 DIAGNOSIS — O09299 Supervision of pregnancy with other poor reproductive or obstetric history, unspecified trimester: Secondary | ICD-10-CM | POA: Insufficient documentation

## 2011-05-29 DIAGNOSIS — E079 Disorder of thyroid, unspecified: Secondary | ICD-10-CM | POA: Insufficient documentation

## 2011-05-29 DIAGNOSIS — Z713 Dietary counseling and surveillance: Secondary | ICD-10-CM | POA: Insufficient documentation

## 2011-05-29 DIAGNOSIS — O99810 Abnormal glucose complicating pregnancy: Secondary | ICD-10-CM | POA: Insufficient documentation

## 2011-05-29 DIAGNOSIS — O3430 Maternal care for cervical incompetence, unspecified trimester: Secondary | ICD-10-CM

## 2011-05-29 DIAGNOSIS — O3440 Maternal care for other abnormalities of cervix, unspecified trimester: Secondary | ICD-10-CM | POA: Insufficient documentation

## 2011-05-29 DIAGNOSIS — E039 Hypothyroidism, unspecified: Secondary | ICD-10-CM | POA: Insufficient documentation

## 2011-05-29 DIAGNOSIS — O30009 Twin pregnancy, unspecified number of placenta and unspecified number of amniotic sacs, unspecified trimester: Secondary | ICD-10-CM | POA: Insufficient documentation

## 2011-05-29 NOTE — ED Notes (Signed)
Diabetes Education:  [id number]  EDD: 10/11/2011.  Seen today for review of GDM diet.  By report, 1 hr GTT was 211 mg/dl  Has been started on Lantus insulin for glucose control  Currently at 10 units of Lantus at HS.  Fasting blood glucose 123, 115, 126, 115, 131, 123, 115, 115,122, 113.  Post meal readings have not consistently been at 1 or 2 hrs post meal and vary.  Post-Breakfast is 173-131,148,111, 144.  Post lunch 134,133, 112, 125, 120.  Post-dinner: 163, 141, 116, 145, 148, 121, 131.   She is to start try to follow the GDM diet and to record her glucose levels.  Goal is for fasting at 60-90 mg/dl and 2 hr post-meal at 119 mg/dl or less.  Currently at 20 weeks and 5 days.  As the pregnancy progresses, glucose levels are going to be more difficult to control.  Currently, she could use a few more units of insulin to help cover the night time.  Will need to add a rapid or short acting insulin for pre-meals as needed.  At some point, Tanish Sinkler have to consider the use of an insulin pump.  This is a lady that will benefit and is willing to participate in aggressive insulin therapy for appropriate glucose control.  At times, the MD's at [facility] will assist with monitoring blood glucose and insulin dosing.  Ms. Chambers Patience Nuzzo be a patient that would benefit with a referral to an endocrinologist.  Sometimes, Dr. Talmage Nap will accept pregnant patients and assist with glucose control.  Ms. Henrie is to call me or e-mail me with questions of if I can be of further assistance.  Maggie Xzavier Swinger, RN, RD, CDE

## 2011-06-12 ENCOUNTER — Inpatient Hospital Stay (HOSPITAL_COMMUNITY)
Admission: AD | Admit: 2011-06-12 | Discharge: 2011-06-13 | Disposition: A | Discharge status: Home / Self Care | Payer: 59 | Source: Ambulatory Visit | Attending: Obstetrics and Gynecology | Admitting: Obstetrics and Gynecology

## 2011-06-12 ENCOUNTER — Other Ambulatory Visit (HOSPITAL_COMMUNITY): Payer: Self-pay | Admitting: Maternal and Fetal Medicine

## 2011-06-12 ENCOUNTER — Ambulatory Visit (HOSPITAL_COMMUNITY)
Admission: RE | Admit: 2011-06-12 | Discharge: 2011-06-12 | Disposition: A | Discharge status: Home / Self Care | Payer: 59 | Source: Ambulatory Visit | Attending: Obstetrics | Admitting: Obstetrics

## 2011-06-12 ENCOUNTER — Encounter (HOSPITAL_COMMUNITY): Payer: Self-pay | Admitting: *Deleted

## 2011-06-12 VITALS — BP 136/71 | HR 110 | Wt 264.0 lb

## 2011-06-12 DIAGNOSIS — O209 Hemorrhage in early pregnancy, unspecified: Secondary | ICD-10-CM | POA: Insufficient documentation

## 2011-06-12 DIAGNOSIS — O99810 Abnormal glucose complicating pregnancy: Secondary | ICD-10-CM | POA: Insufficient documentation

## 2011-06-12 DIAGNOSIS — O09299 Supervision of pregnancy with other poor reproductive or obstetric history, unspecified trimester: Secondary | ICD-10-CM | POA: Insufficient documentation

## 2011-06-12 DIAGNOSIS — O3430 Maternal care for cervical incompetence, unspecified trimester: Secondary | ICD-10-CM

## 2011-06-12 DIAGNOSIS — E669 Obesity, unspecified: Secondary | ICD-10-CM | POA: Insufficient documentation

## 2011-06-12 DIAGNOSIS — IMO0002 Reserved for concepts with insufficient information to code with codable children: Secondary | ICD-10-CM | POA: Insufficient documentation

## 2011-06-12 DIAGNOSIS — O30009 Twin pregnancy, unspecified number of placenta and unspecified number of amniotic sacs, unspecified trimester: Secondary | ICD-10-CM | POA: Insufficient documentation

## 2011-06-12 DIAGNOSIS — E039 Hypothyroidism, unspecified: Secondary | ICD-10-CM | POA: Insufficient documentation

## 2011-06-12 DIAGNOSIS — O3440 Maternal care for other abnormalities of cervix, unspecified trimester: Secondary | ICD-10-CM | POA: Insufficient documentation

## 2011-06-12 DIAGNOSIS — E079 Disorder of thyroid, unspecified: Secondary | ICD-10-CM | POA: Insufficient documentation

## 2011-06-12 DIAGNOSIS — O99280 Endocrine, nutritional and metabolic diseases complicating pregnancy, unspecified trimester: Secondary | ICD-10-CM | POA: Insufficient documentation

## 2011-06-12 HISTORY — DX: Gestational diabetes mellitus in pregnancy, unspecified control: O24.419

## 2011-06-12 NOTE — MAU Note (Signed)
Pt reports she was seen in MFM today and had a transvaginal u/s , has a cerclage due to 14 week loss of twin A of this preg. Pt reports bright red bleeding in toilet and on tissue about 30 minutes ago.denies pain.

## 2011-06-13 ENCOUNTER — Inpatient Hospital Stay (HOSPITAL_COMMUNITY): Payer: 59

## 2011-06-13 NOTE — MAU Provider Note (Signed)
History   Sheila Evans is a 36 y.o. G3P0020 at [redacted]w[redacted]d twin gestation w/ hx of Twin A delivery at 14 wks and rescue cerclage placement. Twin A placenta remains in LUS anterior.  Followed by weekly visits, alternating MFM and WOB. Care w/ Dr. Ernestina Penna. C/O red spotting this evening with wiping, s/p TV US earlier today w/ MFM, CL 3.0, stitch intact, no funneling. Denies cramping / pain / fever / LOF.   Pregnancy complicated by GDM insulin dependent and hypothyroidism, followed by Dr. Talmage Nap.   Arrival date and time: 06/12/11 2227 CNM notified at 0015 then requested to eval patient at 0030.   None     Chief Complaint  Patient presents with  . Vaginal Bleeding   HPI  OB History    Grav Para Term Preterm Abortions TAB SAB Ect Mult Living   3 0 0 0 2 1 1 0 0 0       Past Medical History  Diagnosis Date  . Headache     teenager  . Hypertension   . Hypothyroid   . Abnormal Pap smear     leep, 3 normal since  . Infertility, female   . Gestational diabetes     Past Surgical History  Procedure Date  . Leep   . Leep   . Tonsillectomy   . Cervical cerclage 04/18/2011    Procedure: CERCLAGE CERVICAL;  Surgeon: Tresa Endo A. Ernestina Penna, MD;  Location: [facility];  Service: Gynecology;  Laterality: N/A;    Family History  Problem Relation Age of Onset  . Anesthesia problems Neg Hx     History  Substance Use Topics  . Smoking status: Former Games developer  . Smokeless tobacco: Never Used  . Alcohol Use: No    Allergies:  Allergies  Allergen Reactions  . Latex Rash    Prescriptions prior to admission  Medication Sig Dispense Refill  . calcium carbonate (TUMS EX) 750 MG chewable tablet Chew 1 tablet by mouth as needed.      . docusate sodium (COLACE) 100 MG capsule Take 100 mg by mouth daily.      . insulin glargine (LANTUS) 100 UNIT/ML injection Inject 35 Units into the skin at bedtime.       Marland Kitchen levothyroxine (SYNTHROID, LEVOTHROID) 137 MCG tablet Take 1 tablet (137 mcg total) by mouth  daily.  30 tablet  30  . Prenatal Vit-Fe Fumarate-FA (PRENATAL MULTIVITAMIN) TABS Take 1 tablet by mouth daily.        Review of Systems  Constitutional: Negative for fever and chills.  Gastrointestinal: Negative for nausea and abdominal pain.  Genitourinary: Positive for vaginal bleeding. Negative for dysuria and urgency.   Physical Exam   Blood pressure 144/75, pulse 103, temperature 99.8 F (37.7 C), temperature source Oral, resp. rate 20, height 5\' 6"  (1.676 m), weight 120.657 kg (266 lb), last menstrual period 01/04/2011, SpO2 100.00%.  Physical Exam  Constitutional: She is oriented to person, place, and time. She appears well-developed and well-nourished. No distress.  HENT:  Head: Normocephalic.  Eyes: Pupils are equal, round, and reactive to light.  Cardiovascular: Normal rate and regular rhythm.   Respiratory: Effort normal and breath sounds normal.  GI: Soft. Bowel sounds are normal. There is no tenderness. There is no rebound.  Genitourinary: Vagina normal. No bleeding (scant amount of dark brown d/c on pad) around the vagina.  Musculoskeletal: Normal range of motion. She exhibits no edema.  Neurological: She is alert and oriented to person, place, and time.  Skin:  Skin is warm and dry.    MAU Course  Procedures    Assessment and Plan  IUP at 22w 6d, twin gest, s/p loss of twin A at 14 wks, retained twin A placenta, cerclage in place Vaginal bleed, unsure etiology No evidence of abruption or cervical change per translabial [place], CL 3.5cm Reassuring FWB, SCWD, nl AFI  Plan DC home, continue on modified bed rest and pelvic rest. F/U w/ Dr. Ernestina Penna as scheduled next Wednesday Plan BMZ at 24 wks  Bron Snellings 06/13/2011, 1:12 AM

## 2011-06-13 NOTE — Progress Notes (Signed)
Dr. Cherly Hensen notified patient presented with vaginal bleeding. No pad worn but blood was seen in when the patient wiped and used the bathroom. Patient denies pain at this time. Ultrasound ordered. Will try to contact Dr. Ernestina Penna the patients primary doctor.

## 2011-06-13 NOTE — Discharge Instructions (Signed)
Continue modified bed rest and pelvic rest until you see Dr. Ernestina Penna.

## 2011-06-26 ENCOUNTER — Ambulatory Visit (HOSPITAL_COMMUNITY)
Admission: RE | Admit: 2011-06-26 | Discharge: 2011-06-26 | Disposition: A | Discharge status: Home / Self Care | Payer: 59 | Source: Ambulatory Visit | Attending: Obstetrics | Admitting: Obstetrics

## 2011-06-26 DIAGNOSIS — O30009 Twin pregnancy, unspecified number of placenta and unspecified number of amniotic sacs, unspecified trimester: Secondary | ICD-10-CM | POA: Insufficient documentation

## 2011-06-26 DIAGNOSIS — O99210 Obesity complicating pregnancy, unspecified trimester: Secondary | ICD-10-CM | POA: Insufficient documentation

## 2011-06-26 DIAGNOSIS — O3440 Maternal care for other abnormalities of cervix, unspecified trimester: Secondary | ICD-10-CM | POA: Insufficient documentation

## 2011-06-26 DIAGNOSIS — O99810 Abnormal glucose complicating pregnancy: Secondary | ICD-10-CM | POA: Insufficient documentation

## 2011-06-26 DIAGNOSIS — E079 Disorder of thyroid, unspecified: Secondary | ICD-10-CM | POA: Insufficient documentation

## 2011-06-26 DIAGNOSIS — O09299 Supervision of pregnancy with other poor reproductive or obstetric history, unspecified trimester: Secondary | ICD-10-CM | POA: Insufficient documentation

## 2011-06-26 DIAGNOSIS — O3430 Maternal care for cervical incompetence, unspecified trimester: Secondary | ICD-10-CM

## 2011-06-26 DIAGNOSIS — E039 Hypothyroidism, unspecified: Secondary | ICD-10-CM | POA: Insufficient documentation

## 2011-06-26 DIAGNOSIS — E669 Obesity, unspecified: Secondary | ICD-10-CM | POA: Insufficient documentation

## 2011-07-10 ENCOUNTER — Encounter (HOSPITAL_COMMUNITY)
Admission: AD | Disposition: A | Discharge status: Home / Self Care | Payer: Self-pay | Source: Ambulatory Visit | Attending: Obstetrics & Gynecology

## 2011-07-10 ENCOUNTER — Encounter (HOSPITAL_COMMUNITY): Payer: Self-pay | Admitting: Anesthesiology

## 2011-07-10 ENCOUNTER — Inpatient Hospital Stay: Admit: 2011-07-10 | Payer: Self-pay | Admitting: Obstetrics

## 2011-07-10 ENCOUNTER — Encounter (HOSPITAL_COMMUNITY): Payer: Self-pay | Admitting: *Deleted

## 2011-07-10 ENCOUNTER — Ambulatory Visit (HOSPITAL_COMMUNITY): Admission: RE | Admit: 2011-07-10 | Payer: 59 | Source: Ambulatory Visit

## 2011-07-10 ENCOUNTER — Inpatient Hospital Stay (HOSPITAL_COMMUNITY)
Admission: AD | Admit: 2011-07-10 | Discharge: 2011-07-14 | DRG: 765 | Disposition: A | Discharge status: Home / Self Care | Payer: 59 | Source: Ambulatory Visit | Attending: Obstetrics & Gynecology | Admitting: Obstetrics & Gynecology

## 2011-07-10 ENCOUNTER — Inpatient Hospital Stay (HOSPITAL_COMMUNITY): Payer: 59 | Admitting: Anesthesiology

## 2011-07-10 DIAGNOSIS — E669 Obesity, unspecified: Secondary | ICD-10-CM | POA: Diagnosis present

## 2011-07-10 DIAGNOSIS — E079 Disorder of thyroid, unspecified: Secondary | ICD-10-CM | POA: Diagnosis present

## 2011-07-10 DIAGNOSIS — IMO0002 Reserved for concepts with insufficient information to code with codable children: Secondary | ICD-10-CM | POA: Diagnosis present

## 2011-07-10 DIAGNOSIS — O3430 Maternal care for cervical incompetence, unspecified trimester: Secondary | ICD-10-CM | POA: Diagnosis present

## 2011-07-10 DIAGNOSIS — O99214 Obesity complicating childbirth: Secondary | ICD-10-CM | POA: Diagnosis present

## 2011-07-10 DIAGNOSIS — O4290 Premature rupture of membranes, unspecified as to length of time between rupture and onset of labor, unspecified weeks of gestation: Principal | ICD-10-CM | POA: Diagnosis present

## 2011-07-10 DIAGNOSIS — O30009 Twin pregnancy, unspecified number of placenta and unspecified number of amniotic sacs, unspecified trimester: Secondary | ICD-10-CM

## 2011-07-10 DIAGNOSIS — O99814 Abnormal glucose complicating childbirth: Secondary | ICD-10-CM | POA: Diagnosis present

## 2011-07-10 DIAGNOSIS — E039 Hypothyroidism, unspecified: Secondary | ICD-10-CM | POA: Diagnosis present

## 2011-07-10 HISTORY — PX: CERVICAL CERCLAGE: SHX1329

## 2011-07-10 LAB — CBC
HCT: 39.5 % (ref 36.0–46.0)
MCH: 32.1 pg (ref 26.0–34.0)
MCHC: 33.9 g/dL (ref 30.0–36.0)
RDW: 13.4 % (ref 11.5–15.5)

## 2011-07-10 LAB — GLUCOSE, CAPILLARY: Glucose-Capillary: 115 mg/dL — ABNORMAL HIGH (ref 70–99)

## 2011-07-10 LAB — TYPE AND SCREEN: Antibody Screen: NEGATIVE

## 2011-07-10 SURGERY — Surgical Case
Anesthesia: Monitor Anesthesia Care | Site: Abdomen | Wound class: Clean Contaminated

## 2011-07-10 SURGERY — CERCLAGE, CERVIX, VAGINAL APPROACH
Anesthesia: Monitor Anesthesia Care | Site: Vagina | Wound class: Clean Contaminated

## 2011-07-10 MED ORDER — LACTATED RINGERS IV SOLN
INTRAVENOUS | Status: DC
  Administered 2011-07-10 (×2): via INTRAVENOUS

## 2011-07-10 MED ORDER — LEVOTHYROXINE SODIUM 137 MCG PO TABS
137.0000 ug | ORAL_TABLET | Freq: Every day | ORAL | Status: DC
  Administered 2011-07-11 – 2011-07-14 (×4): 137 ug via ORAL
  Filled 2011-07-10 (×5): qty 1

## 2011-07-10 MED ORDER — SCOPOLAMINE 1 MG/3DAYS TD PT72
MEDICATED_PATCH | TRANSDERMAL | Status: AC
  Administered 2011-07-10: 1.5 mg via TRANSDERMAL
  Filled 2011-07-10: qty 1

## 2011-07-10 MED ORDER — FENTANYL CITRATE 0.05 MG/ML IJ SOLN
INTRAMUSCULAR | Status: AC
  Filled 2011-07-10: qty 2

## 2011-07-10 MED ORDER — ONDANSETRON HCL 4 MG/2ML IJ SOLN: 4.0000 mg | Freq: Three times a day (TID) | INTRAMUSCULAR | Status: DC | PRN

## 2011-07-10 MED ORDER — DIPHENHYDRAMINE HCL 50 MG/ML IJ SOLN: 25.0000 mg | INTRAMUSCULAR | Status: DC | PRN

## 2011-07-10 MED ORDER — FENTANYL CITRATE 0.05 MG/ML IJ SOLN
INTRAMUSCULAR | Status: DC | PRN
  Administered 2011-07-10 (×2): 50 ug via INTRAVENOUS

## 2011-07-10 MED ORDER — MEPERIDINE HCL 25 MG/ML IJ SOLN: 6.2500 mg | INTRAMUSCULAR | Status: DC | PRN

## 2011-07-10 MED ORDER — PHENYLEPHRINE HCL 10 MG/ML IJ SOLN
INTRAMUSCULAR | Status: DC | PRN
  Administered 2011-07-10: 80 ug via INTRAVENOUS
  Administered 2011-07-10: 40 ug via INTRAVENOUS
  Administered 2011-07-10: 80 ug via INTRAVENOUS

## 2011-07-10 MED ORDER — DIBUCAINE 1 % RE OINT: 1.0000 "application " | TOPICAL_OINTMENT | RECTAL | Status: DC | PRN

## 2011-07-10 MED ORDER — DIPHENHYDRAMINE HCL 25 MG PO CAPS: 25.0000 mg | ORAL_CAPSULE | ORAL | Status: DC | PRN

## 2011-07-10 MED ORDER — NALOXONE HCL 0.4 MG/ML IJ SOLN: 0.4000 mg | INTRAMUSCULAR | Status: DC | PRN

## 2011-07-10 MED ORDER — CEFAZOLIN SODIUM-DEXTROSE 2-3 GM-% IV SOLR
2.0000 g | Freq: Four times a day (QID) | INTRAVENOUS | Status: AC
  Administered 2011-07-10 – 2011-07-11 (×4): 2 g via INTRAVENOUS
  Filled 2011-07-10 (×4): qty 50

## 2011-07-10 MED ORDER — KETOROLAC TROMETHAMINE 30 MG/ML IJ SOLN
15.0000 mg | Freq: Once | INTRAMUSCULAR | Status: AC | PRN
  Administered 2011-07-10: 30 mg via INTRAVENOUS

## 2011-07-10 MED ORDER — OXYTOCIN 10 UNIT/ML IJ SOLN
INTRAMUSCULAR | Status: DC | PRN
  Administered 2011-07-10 (×2): 20 [IU] via INTRAMUSCULAR

## 2011-07-10 MED ORDER — OXYCODONE-ACETAMINOPHEN 5-325 MG PO TABS
1.0000 | ORAL_TABLET | ORAL | Status: DC | PRN
  Administered 2011-07-10 – 2011-07-11 (×2): 1 via ORAL
  Administered 2011-07-11 (×2): 2 via ORAL
  Administered 2011-07-11: 1 via ORAL
  Administered 2011-07-11 – 2011-07-14 (×9): 2 via ORAL
  Filled 2011-07-10 (×4): qty 2
  Filled 2011-07-10: qty 1
  Filled 2011-07-10 (×3): qty 2
  Filled 2011-07-10: qty 1
  Filled 2011-07-10: qty 2
  Filled 2011-07-10: qty 1
  Filled 2011-07-10 (×3): qty 2

## 2011-07-10 MED ORDER — SODIUM CHLORIDE 0.9 % IV SOLN
1.0000 ug/kg/h | INTRAVENOUS | Status: DC | PRN
  Filled 2011-07-10: qty 2.5

## 2011-07-10 MED ORDER — IBUPROFEN 600 MG PO TABS
600.0000 mg | ORAL_TABLET | Freq: Four times a day (QID) | ORAL | Status: DC
  Administered 2011-07-10 – 2011-07-14 (×12): 600 mg via ORAL
  Filled 2011-07-10 (×12): qty 1

## 2011-07-10 MED ORDER — MORPHINE SULFATE 0.5 MG/ML IJ SOLN
INTRAMUSCULAR | Status: AC
  Filled 2011-07-10: qty 10

## 2011-07-10 MED ORDER — METOCLOPRAMIDE HCL 5 MG/ML IJ SOLN: 10.0000 mg | Freq: Three times a day (TID) | INTRAMUSCULAR | Status: DC | PRN

## 2011-07-10 MED ORDER — ONDANSETRON HCL 4 MG/2ML IJ SOLN
INTRAMUSCULAR | Status: DC | PRN
  Administered 2011-07-10: 4 mg via INTRAVENOUS

## 2011-07-10 MED ORDER — ONDANSETRON HCL 4 MG PO TABS: 4.0000 mg | ORAL_TABLET | ORAL | Status: DC | PRN

## 2011-07-10 MED ORDER — NALBUPHINE SYRINGE 5 MG/0.5 ML
5.0000 mg | INJECTION | INTRAMUSCULAR | Status: DC | PRN
  Filled 2011-07-10: qty 1

## 2011-07-10 MED ORDER — ACETAMINOPHEN 10 MG/ML IV SOLN
1000.0000 mg | Freq: Four times a day (QID) | INTRAVENOUS | Status: AC | PRN
  Filled 2011-07-10: qty 100

## 2011-07-10 MED ORDER — OXYTOCIN 10 UNIT/ML IJ SOLN
INTRAMUSCULAR | Status: AC
  Filled 2011-07-10: qty 2

## 2011-07-10 MED ORDER — PROMETHAZINE HCL 25 MG/ML IJ SOLN: 6.2500 mg | INTRAMUSCULAR | Status: DC | PRN

## 2011-07-10 MED ORDER — ONDANSETRON HCL 4 MG/2ML IJ SOLN
INTRAMUSCULAR | Status: AC
  Filled 2011-07-10: qty 2

## 2011-07-10 MED ORDER — INSULIN GLARGINE 100 UNIT/ML ~~LOC~~ SOLN
20.0000 [IU] | Freq: Every day | SUBCUTANEOUS | Status: DC
  Administered 2011-07-10 – 2011-07-13 (×4): 20 [IU] via SUBCUTANEOUS
  Filled 2011-07-10: qty 3

## 2011-07-10 MED ORDER — CEFAZOLIN SODIUM 1-5 GM-% IV SOLN
INTRAVENOUS | Status: AC
  Filled 2011-07-10: qty 100

## 2011-07-10 MED ORDER — LIDOCAINE HCL (CARDIAC) 20 MG/ML IV SOLN
INTRAVENOUS | Status: AC
  Filled 2011-07-10: qty 5

## 2011-07-10 MED ORDER — ZOLPIDEM TARTRATE 5 MG PO TABS: 5.0000 mg | ORAL_TABLET | Freq: Every evening | ORAL | Status: DC | PRN

## 2011-07-10 MED ORDER — SENNOSIDES-DOCUSATE SODIUM 8.6-50 MG PO TABS
2.0000 | ORAL_TABLET | Freq: Every day | ORAL | Status: DC
  Administered 2011-07-10 – 2011-07-13 (×4): 2 via ORAL

## 2011-07-10 MED ORDER — CEFAZOLIN SODIUM 1-5 GM-% IV SOLN
INTRAVENOUS | Status: DC | PRN
  Administered 2011-07-10: 2 g via INTRAVENOUS

## 2011-07-10 MED ORDER — KETOROLAC TROMETHAMINE 30 MG/ML IJ SOLN
INTRAMUSCULAR | Status: AC
  Administered 2011-07-10: 30 mg via INTRAVENOUS
  Filled 2011-07-10: qty 1

## 2011-07-10 MED ORDER — FENTANYL CITRATE 0.05 MG/ML IJ SOLN
INTRAMUSCULAR | Status: DC | PRN
  Administered 2011-07-10: 25 ug via INTRATHECAL

## 2011-07-10 MED ORDER — SCOPOLAMINE 1 MG/3DAYS TD PT72
1.0000 | MEDICATED_PATCH | Freq: Once | TRANSDERMAL | Status: AC
  Administered 2011-07-10: 1.5 mg via TRANSDERMAL
  Filled 2011-07-10: qty 1

## 2011-07-10 MED ORDER — LANOLIN HYDROUS EX OINT: 1.0000 "application " | TOPICAL_OINTMENT | CUTANEOUS | Status: DC | PRN

## 2011-07-10 MED ORDER — SODIUM CHLORIDE 0.9 % IJ SOLN: 3.0000 mL | INTRAMUSCULAR | Status: DC | PRN

## 2011-07-10 MED ORDER — DIPHENHYDRAMINE HCL 50 MG/ML IJ SOLN: 12.5000 mg | INTRAMUSCULAR | Status: DC | PRN

## 2011-07-10 MED ORDER — PROPOFOL 10 MG/ML IV EMUL
INTRAVENOUS | Status: DC | PRN
  Administered 2011-07-10 (×2): 2 mL via INTRAVENOUS
  Administered 2011-07-10: 5 mL via INTRAVENOUS
  Administered 2011-07-10: 2 mL via INTRAVENOUS
  Administered 2011-07-10: 5 mL via INTRAVENOUS
  Administered 2011-07-10 (×2): 2 mL via INTRAVENOUS

## 2011-07-10 MED ORDER — ACETAMINOPHEN 325 MG PO TABS: 325.0000 mg | ORAL_TABLET | ORAL | Status: DC | PRN

## 2011-07-10 MED ORDER — MENTHOL 3 MG MT LOZG: 1.0000 | LOZENGE | OROMUCOSAL | Status: DC | PRN

## 2011-07-10 MED ORDER — SIMETHICONE 80 MG PO CHEW: 80.0000 mg | CHEWABLE_TABLET | ORAL | Status: DC | PRN

## 2011-07-10 MED ORDER — EPHEDRINE SULFATE 50 MG/ML IJ SOLN
INTRAMUSCULAR | Status: DC | PRN
  Administered 2011-07-10 (×4): 10 mg via INTRAVENOUS

## 2011-07-10 MED ORDER — TETANUS-DIPHTH-ACELL PERTUSSIS 5-2.5-18.5 LF-MCG/0.5 IM SUSP
0.5000 mL | Freq: Once | INTRAMUSCULAR | Status: DC
  Filled 2011-07-10: qty 0.5

## 2011-07-10 MED ORDER — LACTATED RINGERS IV SOLN
INTRAVENOUS | Status: DC
  Administered 2011-07-10 (×2): via INTRAVENOUS

## 2011-07-10 MED ORDER — DIPHENHYDRAMINE HCL 25 MG PO CAPS: 25.0000 mg | ORAL_CAPSULE | Freq: Four times a day (QID) | ORAL | Status: DC | PRN

## 2011-07-10 MED ORDER — OXYTOCIN 20 UNITS IN LACTATED RINGERS INFUSION - SIMPLE
125.0000 mL/h | INTRAVENOUS | Status: AC
  Filled 2011-07-10: qty 1000

## 2011-07-10 MED ORDER — KETOROLAC TROMETHAMINE 30 MG/ML IJ SOLN: 30.0000 mg | Freq: Four times a day (QID) | INTRAMUSCULAR | Status: AC | PRN

## 2011-07-10 MED ORDER — BUPIVACAINE IN DEXTROSE 0.75-8.25 % IT SOLN
INTRATHECAL | Status: DC | PRN
  Administered 2011-07-10: 1.6 mL via INTRATHECAL

## 2011-07-10 MED ORDER — NALBUPHINE SYRINGE 5 MG/0.5 ML
5.0000 mg | INJECTION | INTRAMUSCULAR | Status: DC | PRN
  Administered 2011-07-10: 5 mg via INTRAVENOUS
  Filled 2011-07-10: qty 1

## 2011-07-10 MED ORDER — FENTANYL CITRATE 0.05 MG/ML IJ SOLN: 25.0000 ug | INTRAMUSCULAR | Status: DC | PRN

## 2011-07-10 MED ORDER — ONDANSETRON HCL 4 MG/2ML IJ SOLN: 4.0000 mg | INTRAMUSCULAR | Status: DC | PRN

## 2011-07-10 MED ORDER — NALBUPHINE SYRINGE 5 MG/0.5 ML
INJECTION | INTRAMUSCULAR | Status: AC
  Administered 2011-07-10: 5 mg via INTRAVENOUS
  Filled 2011-07-10: qty 0.5

## 2011-07-10 MED ORDER — PROPOFOL 10 MG/ML IV EMUL
INTRAVENOUS | Status: AC
  Filled 2011-07-10: qty 40

## 2011-07-10 MED ORDER — MIDAZOLAM HCL 2 MG/2ML IJ SOLN: 0.5000 mg | Freq: Once | INTRAMUSCULAR | Status: AC | PRN

## 2011-07-10 MED ORDER — PRENATAL MULTIVITAMIN CH
1.0000 | ORAL_TABLET | Freq: Every day | ORAL | Status: DC
  Administered 2011-07-11 – 2011-07-13 (×3): 1 via ORAL
  Filled 2011-07-10 (×5): qty 1

## 2011-07-10 MED ORDER — MORPHINE SULFATE (PF) 0.5 MG/ML IJ SOLN
INTRAMUSCULAR | Status: DC | PRN
  Administered 2011-07-10: .1 mg via INTRATHECAL

## 2011-07-10 MED ORDER — SIMETHICONE 80 MG PO CHEW
80.0000 mg | CHEWABLE_TABLET | Freq: Three times a day (TID) | ORAL | Status: DC
  Administered 2011-07-10 – 2011-07-14 (×10): 80 mg via ORAL

## 2011-07-10 MED ORDER — WITCH HAZEL-GLYCERIN EX PADS: 1.0000 "application " | MEDICATED_PAD | CUTANEOUS | Status: DC | PRN

## 2011-07-10 SURGICAL SUPPLY — 17 items
CATH ROBINSON RED A/P 16FR (CATHETERS) IMPLANT
CLOTH BEACON ORANGE TIMEOUT ST (SAFETY) ×2 IMPLANT
COUNTER NEEDLE 1200 MAGNETIC (NEEDLE) IMPLANT
GLOVE BIOGEL M 6.5 STRL (GLOVE) ×4 IMPLANT
GLOVE BIOGEL PI IND STRL 7.0 (GLOVE) ×1 IMPLANT
GLOVE BIOGEL PI INDICATOR 7.0 (GLOVE) ×1
GOWN PREVENTION PLUS LG XLONG (DISPOSABLE) ×6 IMPLANT
NEEDLE MAYO .5 CIRCLE (NEEDLE) IMPLANT
PACK VAGINAL MINOR WOMEN LF (CUSTOM PROCEDURE TRAY) ×2 IMPLANT
PAD PREP 24X48 CUFFED NSTRL (MISCELLANEOUS) ×2 IMPLANT
SUT ETHIBOND  5 (SUTURE)
SUT ETHIBOND 5 (SUTURE) IMPLANT
SUT PROLENE 0 CT 1 30 (SUTURE) ×2 IMPLANT
TOWEL OR 17X24 6PK STRL BLUE (TOWEL DISPOSABLE) ×4 IMPLANT
TUBING NON-CON 1/4 X 20 CONN (TUBING) IMPLANT
WATER STERILE IRR 1000ML POUR (IV SOLUTION) ×2 IMPLANT
YANKAUER SUCT BULB TIP NO VENT (SUCTIONS) IMPLANT

## 2011-07-10 SURGICAL SUPPLY — 34 items
BARRIER ADHS 3X4 INTERCEED (GAUZE/BANDAGES/DRESSINGS) ×2 IMPLANT
CHLORAPREP W/TINT 26ML (MISCELLANEOUS) ×2 IMPLANT
CLOTH BEACON ORANGE TIMEOUT ST (SAFETY) ×2 IMPLANT
CONTAINER PREFILL 10% NBF 15ML (MISCELLANEOUS) IMPLANT
DRESSING TELFA 8X3 (GAUZE/BANDAGES/DRESSINGS) ×2 IMPLANT
ELECT REM PT RETURN 9FT ADLT (ELECTROSURGICAL) ×2
ELECTRODE REM PT RTRN 9FT ADLT (ELECTROSURGICAL) ×1 IMPLANT
EXTRACTOR VACUUM KIWI (MISCELLANEOUS) IMPLANT
EXTRACTOR VACUUM M CUP 4 TUBE (SUCTIONS) IMPLANT
GLOVE BIO SURGEON STRL SZ 6.5 (GLOVE) ×2 IMPLANT
GLOVE BIOGEL PI IND STRL 7.0 (GLOVE) ×2 IMPLANT
GLOVE BIOGEL PI INDICATOR 7.0 (GLOVE) ×2
GOWN PREVENTION PLUS LG XLONG (DISPOSABLE) ×6 IMPLANT
KIT ABG SYR 3ML LUER SLIP (SYRINGE) IMPLANT
NEEDLE HYPO 25X5/8 SAFETYGLIDE (NEEDLE) IMPLANT
NS IRRIG 1000ML POUR BTL (IV SOLUTION) ×2 IMPLANT
PACK C SECTION WH (CUSTOM PROCEDURE TRAY) ×2 IMPLANT
PAD ABD 7.5X8 STRL (GAUZE/BANDAGES/DRESSINGS) ×2 IMPLANT
SLEEVE SCD COMPRESS KNEE MED (MISCELLANEOUS) IMPLANT
SPONGE GAUZE 4X4 12PLY (GAUZE/BANDAGES/DRESSINGS) ×2 IMPLANT
STAPLER VISISTAT 35W (STAPLE) ×2 IMPLANT
STRIP CLOSURE SKIN 1/2X4 (GAUZE/BANDAGES/DRESSINGS) IMPLANT
SUT MON AB 4-0 PS1 27 (SUTURE) IMPLANT
SUT PLAIN 0 NONE (SUTURE) IMPLANT
SUT PLAIN 2 0 XLH (SUTURE) ×2 IMPLANT
SUT VIC AB 0 CT1 36 (SUTURE) ×4 IMPLANT
SUT VIC AB 0 CTX 36 (SUTURE) ×3
SUT VIC AB 0 CTX36XBRD ANBCTRL (SUTURE) ×3 IMPLANT
SUT VIC AB 2-0 CT1 27 (SUTURE) ×1
SUT VIC AB 2-0 CT1 TAPERPNT 27 (SUTURE) ×1 IMPLANT
TAPE CLOTH SURG 4X10 WHT LF (GAUZE/BANDAGES/DRESSINGS) ×2 IMPLANT
TOWEL OR 17X24 6PK STRL BLUE (TOWEL DISPOSABLE) ×4 IMPLANT
TRAY FOLEY CATH 14FR (SET/KITS/TRAYS/PACK) IMPLANT
WATER STERILE IRR 1000ML POUR (IV SOLUTION) IMPLANT

## 2011-07-10 NOTE — Anesthesia Postprocedure Evaluation (Signed)
  Anesthesia Post Note  Patient: Sheila Evans  Procedure(s) Performed: Procedure(s) (LRB): CESAREAN SECTION (N/A)  Anesthesia type: Spinal  Patient location:  Women's Unit  Post pain: Pain level controlled  Post assessment: Post-op Vital signs reviewed  Last Vitals:  Filed Vitals:   07/10/11 1500  BP: 103/62  Pulse: 90  Temp: 36.7 C  Resp: 18    Post vital signs: Reviewed  Level of consciousness: awake  Complications: No apparent anesthesia complications

## 2011-07-10 NOTE — Progress Notes (Signed)
UR Chart review completed.  

## 2011-07-10 NOTE — H&P (Addendum)
36 y.o. G3P002 at 26'5 presents w/ ROM. Pt started this pregnancy as a dichorionic diamniotic IUI twin gestation but has previable delivery of twin A at 16 wks. Prior to that she did have mild cervical shortening followed by PPROM and managed w/ outpt antibiotics prior to presenting w/ delivery of A. After delivery, umbilical cord of A evulsed high, placenta remained intact and cvx lengthened to 3cm. After managing expectantly for several days w/o signs of labor or chorio, decision made to place prophylactic cerclage and attempt pregnancy conservation of baby A. Pt has been followed with cervical length and weekly assessments including CBC to watch for infection. Pt has done well until earlier this wk when she had increased Braxton-Hix ctx, worsening this am and then ROM at 8am.    Her pregnancy history is significant for a 18 weeks fetal loss secondary to PPROM and prior LEEP for CIN 2. At the time of 18 wk PPROM, after counseling pt had cytotec IOL and delivery. Her cervix was apparently closed at that time.   PMH:  Hypothyroidism  Obesity  HA   PSH: LEEP, D&C (TOP in 2002)   All: latex   Meds: PNV, SYnthroid, Colace, Insulin  PE:  Filed Vitals:   07/10/11 0851  BP: 143/87  Pulse: 109  Temp: 98.6 F (37 C)  Resp: 20   Gen: anxious, obese Abd: obese, NT GU: gross ROM, cerclage intact, no bleeding, cvx appears 3cm long and closed LE: no edema Back: no CVAT  Wet prep: + fern Bedside u/s: 2x2 pocket but decreased AFI, breech  CBC    Component Value Date/Time   WBC 14.5* 07/10/2011 0915   RBC 4.17 07/10/2011 0915   HGB 13.4 07/10/2011 0915   HCT 39.5 07/10/2011 0915   PLT 249 07/10/2011 0915   MCV 94.7 07/10/2011 0915   MCH 32.1 07/10/2011 0915   MCHC 33.9 07/10/2011 0915   RDW 13.4 07/10/2011 0915   LYMPHSABS 2.2 05/01/2011 1355   MONOABS 0.9 05/01/2011 1355   EOSABS 0.3 05/01/2011 1355   BASOSABS 0.0 05/01/2011 1355      A/P: Preterm PPROM - remove cerclage now - Plan BMZ and Mag  for neuroprophylaxis, Abx for ROM and expectant management - Risks of prematurity - MOD: c/s - GDM. Cont insulin - hypothryoidism: cont synthroid  Sheila Evans A. 07/10/2011 9:58 AM    Toco: not tracing well, pt noting occasional ctx FH: 140's, good variability and accels, intermittent tracing. Variable decels noted.

## 2011-07-10 NOTE — MAU Note (Signed)
Dr. Fogleman in to see patient. 

## 2011-07-10 NOTE — Anesthesia Preprocedure Evaluation (Deleted)
Anesthesia Evaluation  Patient identified by MRN, date of birth, ID band Patient awake    Reviewed: Allergy & Precautions, H&P , Patient's Chart, lab work & pertinent test results, reviewed documented beta blocker date and time   History of Anesthesia Complications Negative for: history of anesthetic complications  Airway Mallampati: III TM Distance: >3 FB Neck ROM: full    Dental No notable dental hx.    Pulmonary neg pulmonary ROS,  breath sounds clear to auscultation  Pulmonary exam normal       Cardiovascular Exercise Tolerance: Good hypertension, negative cardio ROS  Rhythm:regular Rate:Normal     Neuro/Psych  Headaches, negative neurological ROS  negative psych ROS   GI/Hepatic negative GI ROS, Neg liver ROS,   Endo/Other  negative endocrine ROSDiabetes mellitus-, Insulin DependentHypothyroidism Morbid obesity  Renal/GU negative Renal ROS     Musculoskeletal   Abdominal (+) + obese,   Peds  Hematology negative hematology ROS (+)   Anesthesia Other Findings   Reproductive/Obstetrics negative OB ROS (+) Pregnancy                          Anesthesia Physical Anesthesia Plan  ASA: III and Emergent  Anesthesia Plan: MAC   Post-op Pain Management:    Induction:   Airway Management Planned:   Additional Equipment:   Intra-op Plan:   Post-operative Plan:   Informed Consent: I have reviewed the patients History and Physical, chart, labs and discussed the procedure including the risks, benefits and alternatives for the proposed anesthesia with the patient or authorized representative who has indicated his/her understanding and acceptance.   Dental Advisory Given  Plan Discussed with: CRNA, Surgeon and Anesthesiologist  Anesthesia Plan Comments:         Anesthesia Quick Evaluation  

## 2011-07-10 NOTE — Brief Op Note (Signed)
07/10/2011  1:33 PM  PATIENT:  Sheila Evans  36 y.o. female  PRE-OPERATIVE DIAGNOSIS: 26 weeks and 5 days, rupture of membranes  POST-OPERATIVE DIAGNOSIS:  26 weeks and 5 days, rupture of membranes, labor, footling breech presentation, advanced cervical dilation  PROCEDURE:  Procedure(s) (LRB): CERCLAGE CERVICAL (N/A) removal, classical cesarean section with delivery of baby girl  SURGEON:  Surgeon(s) and Role:    * Anny Sayler A. Ernestina Penna, MD - Primary  PHYSICIAN ASSISTANT:   ASSISTANTS: Alphonzo Severance, MD   ANESTHESIA:   spinal and IV sedation. IV sedation used for cerclage removal; once decision for cesarean section was made patient had a spinal anesthesia placed  EBL:  Total I/O In: 3500 [I.V.:3500] Out: 725 [Urine:325; Blood:400]  BLOOD ADMINISTERED:none  DRAINS: Urinary Catheter (Foley)   LOCAL MEDICATIONS USED:  NONE  SPECIMEN:  Source of Specimen:  placenta, cord pH  DISPOSITION OF SPECIMEN:  PATHOLOGY  COUNTS:  YES  TOURNIQUET:  * No tourniquets in log *  DICTATION: .Note written in EPIC  PLAN OF CARE: Admit to inpatient   PATIENT DISPOSITION:  PACU - hemodynamically stable.   Delay start of Pharmacological VTE agent (>24hrs) due to surgical blood loss or risk of bleeding: yes

## 2011-07-10 NOTE — OR Nursing (Signed)
Recovery care complete time entered.  Patient had urgent cesarean section and was transferred to another room for a different.

## 2011-07-10 NOTE — Anesthesia Preprocedure Evaluation (Addendum)
Anesthesia Evaluation  Patient identified by MRN, date of birth, ID band Patient awake    Reviewed: Allergy & Precautions, H&P , Patient's Chart, lab work & pertinent test results, reviewed documented beta blocker date and time   History of Anesthesia Complications Negative for: history of anesthetic complications  Airway Mallampati: III TM Distance: >3 FB Neck ROM: full    Dental No notable dental hx.    Pulmonary neg pulmonary ROS,  breath sounds clear to auscultation  Pulmonary exam normal       Cardiovascular Exercise Tolerance: Good hypertension, negative cardio ROS  Rhythm:regular Rate:Normal     Neuro/Psych  Headaches, negative neurological ROS  negative psych ROS   GI/Hepatic negative GI ROS, Neg liver ROS,   Endo/Other  negative endocrine ROSDiabetes mellitus-, Insulin DependentHypothyroidism Morbid obesity  Renal/GU negative Renal ROS     Musculoskeletal   Abdominal (+) + obese,   Peds  Hematology negative hematology ROS (+)   Anesthesia Other Findings   Reproductive/Obstetrics negative OB ROS (+) Pregnancy                          Anesthesia Physical Anesthesia Plan  ASA: III and Emergent  Anesthesia Plan: MAC   Post-op Pain Management:    Induction:   Airway Management Planned:   Additional Equipment:   Intra-op Plan:   Post-operative Plan:   Informed Consent: I have reviewed the patients History and Physical, chart, labs and discussed the procedure including the risks, benefits and alternatives for the proposed anesthesia with the patient or authorized representative who has indicated his/her understanding and acceptance.   Dental Advisory Given  Plan Discussed with: CRNA, Surgeon and Anesthesiologist  Anesthesia Plan Comments:         Anesthesia Quick Evaluation

## 2011-07-10 NOTE — Transfer of Care (Signed)
Immediate Anesthesia Transfer of Care Note  Patient: Sheila Evans  Procedure(s) Performed: Procedure(s) (LRB): CERCLAGE CERVICAL (N/A)  Patient Location: PACU  Anesthesia Type: Spinal  Level of Consciousness: awake, alert  and oriented  Airway & Oxygen Therapy: Patient Spontanous Breathing  Post-op Assessment: Report given to PACU RN and Post -op Vital signs reviewed and stable  Post vital signs: Reviewed and stable  Complications: No apparent anesthesia complications

## 2011-07-10 NOTE — Anesthesia Postprocedure Evaluation (Signed)
Anesthesia Post Note  Patient: Sheila Evans  Procedure(s) Performed: Procedure(s) (LRB): CERCLAGE CERVICAL (N/A)  Anesthesia type: Spinal after mac  Patient location: PACU  Post pain: Pain level controlled  Post assessment: Post-op Vital signs reviewed  Last Vitals:  Filed Vitals:   07/10/11 1200  BP: 121/53  Pulse: 73  Temp:   Resp: 16    Post vital signs: Reviewed  Level of consciousness: awake  Complications: No apparent anesthesia complications

## 2011-07-10 NOTE — Op Note (Signed)
07/10/2011  1:33 PM  PATIENT:  Sheila Evans  36 y.o. female  PRE-OPERATIVE DIAGNOSIS: 26 weeks and 5 days, rupture of membranes  POST-OPERATIVE DIAGNOSIS:  26 weeks and 5 days, rupture of membranes, labor, footling breech presentation, advanced cervical dilation  PROCEDURE:  Procedure(s) (LRB): CERCLAGE CERVICAL (N/A) removal, classical cesarean section with delivery of baby girl  SURGEON:  Surgeon(s) and Role:    * Keenen Roessner A. Ernestina Penna, MD - Primary  PHYSICIAN ASSISTANT:   ASSISTANTS: Alphonzo Severance, MD   ANESTHESIA:   spinal and IV sedation. IV sedation used for cerclage removal; once decision for cesarean section was made patient had a spinal anesthesia placed  EBL:  Total I/O In: 3500 [I.V.:3500] Out: 725 [Urine:325; Blood:400]  BLOOD ADMINISTERED:none  DRAINS: Urinary Catheter (Foley)   LOCAL MEDICATIONS USED:  NONE  SPECIMEN:  Source of Specimen:  placenta, cord pH  DISPOSITION OF SPECIMEN:  PATHOLOGY  COUNTS:  YES  TOURNIQUET:  * No tourniquets in log *  DICTATION: .Note written in EPIC  PLAN OF CARE: Admit to inpatient   PATIENT DISPOSITION:  PACU - hemodynamically stable.   Delay start of Pharmacological VTE agent (>24hrs) due to surgical blood loss or risk of bleeding: yes  Antibiotics: 2 g of Ancef  Complications: None  Findings: Intact cerclage, moderate meconium noted at time of cerclage removal, footling breech with foot coming through the cervix after cerclage removal, clear amniotic fluid without evidence of abruption, small uterus, small but otherwise normal placenta of baby B, free floating placenta of baby A ( status post delivery of baby A. at 15 weeks), normal tubes and ovaries.   Indications: This is a 36 year old G3 P0 020 at 26 weeks and 5 days who is of symmetric history is significant for cervical incompetence. This current pregnancy was complicated by a 14 week pre-and viable delivery of baby A with cerclage placement at that time.  Patient presents this morning with rupture of membranes of baby B and occasional contractions. Given the risk of infectious complications as well as further damage to the cervix if patient was in active labor decision with the consultation of MFM was made to proceed with cerclage removal. Patient was aware of surgical risks including infection bleeding and damage to surrounding organs. Patient was also aware of the risk of imminent delivery once the cerclage was removed and the aware of the risks of prematurity.   Findings:  @BABYSEXEBC @ infant,  APGAR (1 MIN): 4   APGAR (5 MINS): 7   APGAR (10 MINS):    EBL: per anasthesia    Procedure:  After informed consent was obtained the patient was taken to the operating room where IV sedation was given.  She was prepped and draped in the normal sterile fashion in dorsal supine lithotomy positiion. No catheterization was done as the patient voided immediately preoperatively. A sterile speculum was placed in the vagina and using pickups and scissors the cerclage was identified and cut the cerclage was removed in its entirety. Immediately after the cerclage was removed moderate meconium was noted draining from the cervix. The cervix dilated to 4 cm and a fetal foot was protruding through the cervix into the vagina. At this point the decision was made to proceed with cesarean section.  Patient was transferred to an obstetrical operating room. Anesthesia was consulted and placed a spinal. Patient was re\re prepped in the dorsal supine position with a leftward tilt and a Foley catheter was placed in the bladder.  A Pfannenstiel  skin incision was made 2 cm above the pubic symphysis in the midline with the scalpel.  Dissection was carried down through a very thick layer of subcutaneous fat with the Bovie cautery until the fascia was reached. The fascia was incised in the midline. The incision was extended laterally with the Mayo scissors. The inferior aspect of the  fascial incision was grasped with the Coker clamps, elevated up and the underlying rectus muscles were dissected off sharply. The superior aspect of the fascial incision was grasped with the Coker clamps elevated up and the underlying rectus muscles were dissected off sharply.  The peritoneum was entered sharply. The peritoneal incision was extended superiorly and inferiorly with good visualization of the bladder. The bladder blade was inserted, the vesicouterine peritoneum was identified grasped with the pickups and entered sharply. The bladder flap was created digitally the bladder blade was reinserted. Palpation was done to assess the fetal position and the location of the uterine vessels. Patient was known to be footling breech and given the early gestational age the lower uterine segment was poorly developed. The decision was made to proceed with a classical cesarean section. No bladder flap was created. With good retraction and adequate visualization of the small uterus a vertical incision in the uterus was made with the scalpel. The myometrium was quite thick. Eventually the amniotic sac was entered, the uterine incision was extended superiorly and inferiorly with the bandage scissors. The infant's sacrum was attempted to be grasped however we were eventually able to grab the fetal feet and legs and the fetus was delivered in the breech presentation. Prior was noted the cord was clamped and cut and the infant was handed off to the NICU. After delivery of the baby a 6 cm disc-shaped piece of placenta extruded through the incision this was the placenta of the prior delivered baby A.  The placenta was expressed. The uterus was exteriorized. The uterus was cleared of all clots and debris. The uterine incision was repaired with 0 Vicryl in a running locked fashion in a vertical fashion .  A second layer of the same suture was used in an imbricating fashion to obtain excellent hemostasis. Several additional  figure-of-eight sutures were used to reapproximate the myometrium and to obtain hemostasis. A 4-0 Vicryl was then used to close the serosal layer in a baseball stitch fashion good hemostasis was noted.  The uterus was then returned to the abdomen, the gutters were cleared of all clots and debris. The uterine incision was reinspected and found to be hemostatic.  a piece of Interceed was placed over the uterine incision The peritoneum was grasped and closed with 2-0 Vicryl in a running fashion. The cut muscle edges and the underside of the fascia were inspected and found to be hemostatic. The fascia was closed with 0 Vicryl in two halves. The subcutaneous tissue was irrigated. Scarpa's layer was closed with a 2-0 plain gut suture. 2 layers of plain gut were used to close the very thick Scarpa's layer and subcutaneous tissue.  The skin was closed withstaples. The patient tolerated the procedure well. Sponge lap and needle counts were correct x3 and patient was taken to the recovery room in a stable condition.  Anays Detore A. 07/10/2011 1:41 PM

## 2011-07-10 NOTE — Anesthesia Preprocedure Evaluation (Addendum)
Anesthesia Evaluation  Patient identified by MRN, date of birth, ID band Patient awake    Reviewed: Allergy & Precautions, H&P , Patient's Chart, lab work & pertinent test results, reviewed documented beta blocker date and time   History of Anesthesia Complications Negative for: history of anesthetic complications  Airway Mallampati: III TM Distance: >3 FB Neck ROM: full    Dental No notable dental hx.    Pulmonary neg pulmonary ROS,  breath sounds clear to auscultation  Pulmonary exam normal       Cardiovascular Exercise Tolerance: Good hypertension, negative cardio ROS  Rhythm:regular Rate:Normal     Neuro/Psych  Headaches, negative neurological ROS  negative psych ROS   GI/Hepatic negative GI ROS, Neg liver ROS,   Endo/Other  negative endocrine ROSDiabetes mellitus-, Insulin DependentHypothyroidism Morbid obesity  Renal/GU negative Renal ROS     Musculoskeletal   Abdominal (+) + obese,   Peds  Hematology negative hematology ROS (+)   Anesthesia Other Findings   Reproductive/Obstetrics negative OB ROS (+) Pregnancy                           Anesthesia Physical  Anesthesia Plan  ASA: III and Emergent  Anesthesia Plan: Spinal   Post-op Pain Management:    Induction:   Airway Management Planned:   Additional Equipment:   Intra-op Plan:   Post-operative Plan:   Informed Consent: I have reviewed the patients History and Physical, chart, labs and discussed the procedure including the risks, benefits and alternatives for the proposed anesthesia with the patient or authorized representative who has indicated his/her understanding and acceptance.   Dental Advisory Given  Plan Discussed with: CRNA, Surgeon and Anesthesiologist  Anesthesia Plan Comments:         Anesthesia Quick Evaluation

## 2011-07-11 ENCOUNTER — Encounter (HOSPITAL_COMMUNITY): Payer: Self-pay | Admitting: Obstetrics and Gynecology

## 2011-07-11 ENCOUNTER — Encounter (HOSPITAL_COMMUNITY): Admit: 2011-07-11 | Discharge: 2011-07-11 | Disposition: A | Discharge status: Home / Self Care | Payer: 59

## 2011-07-11 LAB — CBC
HCT: 32.9 % — ABNORMAL LOW (ref 36.0–46.0)
MCHC: 33.4 g/dL (ref 30.0–36.0)
RDW: 13.6 % (ref 11.5–15.5)

## 2011-07-11 LAB — GLUCOSE, CAPILLARY

## 2011-07-11 NOTE — Progress Notes (Signed)
Patient ID: Sheila Evans, female   DOB: 1975/09/27, 36 y.o.   MRN: [id number] POD # 1  Subjective: Pt reports feeling well/ Pain controlled with ibuprofen and percocet Tolerating po/ Foley d/c'ed and has voided/ No n/v/Flatus pos Activity: out of bed and ambulate Bleeding is light Newborn info:  Information for the patient's newborn:  [name], Bula [[id number]]  female Feeding: breast   Objective: VS: Blood pressure 127/73, pulse 82, temperature 98.2 F (36.8 C), temperature source Oral, resp. rate 18.    Intake/Output Summary (Last 24 hours) at 07/11/11 1035 Last data filed at 07/11/11 0854  Gross per 24 hour  Intake   3840 ml  Output   3425 ml  Net    415 ml      Basename 07/11/11 0540 07/10/11 0915  WBC 12.3* 14.5*  HGB 11.0* 13.4  HCT 32.9* 39.5  PLT 203 249    Blood type: --/--/A POS (05/16 0915) Rubella:      Physical Exam:  General: alert, cooperative, no distress and morbidly obese CV: Regular rate and rhythm Resp: clear Abdomen: soft, nontender, normal bowel sounds Incision: Dsg in place and C/D/I Uterine Fundus: firm, below umbilicus, nontender Lochia: minimal Ext: edema +1 to 2 pedal and pretib edema and Homans sign is negative, no sign of DVT    A/P: POD # 1/ G3P0121 S/P C/Section d/t footling presentation upon removal of cerclage Hx GDM; insulin continued, FBS 82 Hypothyroidism; on levothyroxine Doing well Continue routine post op orders   Signed: Demetrius Revel, MSN, [facility] 07/11/2011, 10:35 AM

## 2011-07-12 ENCOUNTER — Encounter (HOSPITAL_COMMUNITY): Payer: Self-pay | Admitting: Obstetrics

## 2011-07-12 LAB — GLUCOSE, CAPILLARY: Glucose-Capillary: 98 mg/dL (ref 70–99)

## 2011-07-12 NOTE — Progress Notes (Signed)
CLINICAL SOCIAL WORK  BRIEF PSYCHOSOCIAL ASSESSMENT Sheila Evans Referred by: NICU    on: 07/10/2011   for:  NICU admission X Patient Interview X Family Interview  Other:  PSYCHOSOCIAL DATA:   MOB lives with FOB, step-daughter, and step son. Primary Support (Name/Relationship): FOB Degree of support available: consistent CURRENT CONCERNS:      NICU admission    SOCIAL WORK ASSESSMENT/PLAN:  No Further Intervention Required  NICU Brochure given PATIENT'S/FAMILY'S RESPONSE TO PLAN OF CARE: Met with MOB at bedside for support following baby's NICU admission.  Maternal grandmother was present and supportive. This is MOB's first child following two prior miscarriages.  The family is very happy to have this baby in their lives.  MOB reports that she is "realistic" and hopeful for baby's health, well being and future.  She is pleased with the level of care she and baby are receiving.  She is pleased with the information and updates she has received about her baby, and she feels she has a good understanding of her baby's needs at this time.  MOB relayed her experience of having changed her baby's diaper for the first time.  She feels good overall and reports no questions or concerns at this time.  MOB has a very close relationship with her mother, who has been by her side throughout this experience.  MOB reports having a very small list of people who can visit her baby.  Commended her for maintaining healthy boundaries and being in tune with baby's need for limited stimulation.  Provided MOB NICU brochure and encouraged her to follow up as needed during her stay.  I shortened my visit as provider came in desiring to examine MOB.  I advised how we can continue to check in and support MOB and her family during her baby's stay in the NICU.   Staci Acosta, MSW, LCSW 07/12/2011, 4:13 pm

## 2011-07-12 NOTE — Progress Notes (Signed)
Subjective: POD# 2 Information for the patient's newborn:  Sheila, Evans [[id number]]  female   Reports feeling well. Feeding: breast / pumping Patient reports tolerating PO.  Breast symptoms: colostrum present Pain controlled with Motrin and Percocet Denies HA/SOB/C/P/N/V/dizziness. Flatus present. She reports vaginal bleeding as normal, without clots.  She is ambulating, urinating without difficult.     Objective:   VS:  Filed Vitals:   07/11/11 1000 07/11/11 2157 07/12/11 0530 07/12/11 1220  BP: 127/73 122/78 119/74 121/81  Pulse: 82 94 78 82  Temp: 98.2 F (36.8 C) 98.8 F (37.1 C) 98.1 F (36.7 C) 98.4 F (36.9 C)  TempSrc: Oral Oral Oral Oral  Resp: 18 18 18 20   Height:      Weight:      SpO2: 94% 96% 98% 97%    No intake or output data in the 24 hours ending 07/12/11 1550      Basename 07/11/11 0540 07/10/11 0915  WBC 12.3* 14.5*  HGB 11.0* 13.4  HCT 32.9* 39.5  PLT 203 249   CBG (last 3)   Basename 07/12/11 1221 07/12/11 0628 07/11/11 2143  GLUCAP 98 95 118*       Blood type: --/--/A POS (05/16 0915)  Rubella:   immune    Physical Exam:  General: alert, cooperative, no distress and morbidly obese CV: Regular rate and rhythm Resp: clear Abdomen: soft, nontender, normal bowel sounds Incision: clean, dry, intact and staples intact, + panus Uterine Fundus: firm, below umbilicus, nontender Lochia: minimal Ext: Homans sign is negative, no sign of DVT and no edema, redness or tenderness in the calves or thighs      Assessment/Plan: 36 y.o.   POD# 2.  s/p Cesarean Delivery.  Indications: breech and PPROM 26 5/7 wks, advanced dilation                Principal Problem:  *PP C/S 26w 5d 07/10/11 F (NICU) Hypothyroidism - continue levothyroxine GDM on Lantus - stable BS, continue present course.  Doing well, stable.     Ambulate Routine post-op care  Avi Archuleta 07/12/2011, 3:50 PM

## 2011-07-13 LAB — GLUCOSE, CAPILLARY
Glucose-Capillary: 103 mg/dL — ABNORMAL HIGH (ref 70–99)
Glucose-Capillary: 92 mg/dL (ref 70–99)

## 2011-07-13 NOTE — Progress Notes (Signed)
LCSW follow up attempted today due to having to shorten visit yesterday due to provider visit.  I introduced myself again to [facility] and she advised she was eating lunch right now and requested I come back another time.  FOB was also in the room.  I advised we will continue to follow up and support her as needed.  I updated bedside RN, who advised patient was having a more emotional day due to her baby's current medical course.  I will inform weekday SW staff for ongoing follow up throughout mom and baby's stay, as well as the SSI eligibility that I was not able to discuss at first visit due to provider having visited.  Staci Acosta, MSW LCSW, 07/13/2011, 12:29 pm

## 2011-07-13 NOTE — Progress Notes (Signed)
POD #3 s/p RCS  S: pt sitting in NICU w/ baby. Pt slightly more emotional today as baby needed blood last night, still on vent, not able to tolerate feeds. Pt is tol reg po, minimal lochia. Pain controlled w/ po meds. Reports RLQ "pulling" from earlier is now gone.  No CP/ SOB. No fevers. Nl voids, passing flatus. No BM yet. No leg pain. Up and walking around. Pumping breast milk and starting to have production. Pt on Lantus 20 units qhs, not needing additional insulin. Plan for tight BS control as pt is healing from surgical incision.   O:  Filed Vitals:   07/12/11 1220 07/12/11 1800 07/12/11 2215 07/13/11 0600  BP: 121/81 123/79 99/64 104/70  Pulse: 82 88 98 70  Temp: 98.4 F (36.9 C) 98 F (36.7 C) 98 F (36.7 C) 98.3 F (36.8 C)  TempSrc: Oral Oral Oral Oral  Resp: 20 20 20 18   Height:      Weight:      SpO2: 97% 97% 98% 97%    Gen: well appearing, no distress CV: RRR Pulm: CTAB Abd: obese, NT Inc: pt states no trouble, not assessed as pt in chair in NICU LE: NT, no edema  A/P: 36 yo POD #3 s/p PCS- classical for PPROM, breech, and imminent delivery/ labor after removal of cerclage. - Pt recovering appropriately from PCS. Pt did 24 hrs of abx post-op due to risks for would infection: cerclage removal immediatley before c/s, lack of hair clipping from surgical site, morbid obesity, stat c/s and GDM. No evidence infection today. - GDM, possibl T2DM. Cont insulin. Pt to f/u w/ Dr. Talmage Nap PP for management.  - Baby in NICU, stable but guarded due to prematurity. - Watch for si/sx depression  Sheila Evans A. 07/13/2011 11:43 AM

## 2011-07-14 LAB — GLUCOSE, CAPILLARY
Glucose-Capillary: 120 mg/dL — ABNORMAL HIGH (ref 70–99)
Glucose-Capillary: 79 mg/dL (ref 70–99)
Glucose-Capillary: 86 mg/dL (ref 70–99)

## 2011-07-14 MED ORDER — OXYCODONE-ACETAMINOPHEN 5-325 MG PO TABS: 1.0000 | ORAL_TABLET | ORAL | Status: AC | PRN

## 2011-07-14 MED ORDER — IBUPROFEN 600 MG PO TABS: 600.0000 mg | ORAL_TABLET | Freq: Four times a day (QID) | ORAL | Status: AC

## 2011-07-14 NOTE — Discharge Summary (Signed)
POSTOPERATIVE DISCHARGE SUMMARY:  Patient ID: Sheila Evans MRN: [id number] DOB/AGE: 34-Nov-1977 34 y.o.  Admit date: 07/10/2011 Discharge date:  07/14/2011   Admission Diagnoses: 1. Preterm premature rupture of membranes at 26 5/7 weeks, cerclage in place 2. Breech 3. Gestational diabetes on insulin 4. Hypothyroidism  Discharge Diagnoses:  1. Post operative day 4 2. Status post classical cesarean section for footling breech in labor 3. Gestational diabetes delivered -  continues on insulin 4. Hypothyroidism   Prenatal history: [id number]   EDC : 10/11/2011, by Last Menstrual Period  Prenatal care at [facility] since 1st trimester  Prenatal course complicated by loss of twin A at 16 weeks, gestational diabetes on insulin, morbid obesity  Prenatal Labs: ABO, Rh:    Antibody: NEG (05/16 0915) Rubella:   immune RPR:   NR HBsAg:   neg HIV:   neg GBS:   unknown 1 hr Glucola : 211   Medical / Surgical History :  Past medical history:  Past Medical History  Diagnosis Date  . Headache     teenager  . Hypertension   . Hypothyroid   . Abnormal Pap smear     leep, 3 normal since  . Infertility, female   . Gestational diabetes   . PP C/S 26w 5d 07/10/11 F (NICU) 07/11/2011    Past surgical history:  Past Surgical History  Procedure Date  . Leep   . Leep   . Tonsillectomy   . Cervical cerclage 04/18/2011    Procedure: CERCLAGE CERVICAL;  Surgeon: Tresa Endo A. Ernestina Penna, MD;  Location: [facility]S;  Service: Gynecology;  Laterality: N/A;  . Cervical cerclage 07/10/2011    Procedure: CERCLAGE CERVICAL;  Surgeon: Tresa Endo A. Ernestina Penna, MD;  Location: [facility] ORS;  Service: Gynecology;  Laterality: N/A;  removal  . Cesarean section 07/10/2011    Procedure: CESAREAN SECTION;  Surgeon: Tresa Endo A. Ernestina Penna, MD;  Location: [facility] ORS;  Service: Gynecology;  Laterality: N/A;    Family History:  Family History  Problem Relation Age of Onset  . Anesthesia problems Neg Hx     Social  History:  reports that she has quit smoking. She has never used smokeless tobacco. She reports that she does not drink alcohol or use illicit drugs.   Allergies: Latex    Current Medications at time of admission:  Prescriptions prior to admission  Medication Sig Dispense Refill  . calcium carbonate (TUMS EX) 750 MG chewable tablet Chew 1 tablet by mouth as needed.      . docusate sodium (COLACE) 100 MG capsule Take 100 mg by mouth daily.      . insulin glargine (LANTUS) 100 UNIT/ML injection Inject 35 Units into the skin at bedtime.       Marland Kitchen levothyroxine (SYNTHROID, LEVOTHROID) 137 MCG tablet Take 1 tablet (137 mcg total) by mouth daily.  30 tablet  30  . Prenatal Vit-Fe Fumarate-FA (PRENATAL MULTIVITAMIN) TABS Take 1 tablet by mouth daily.      Marland Kitchen DISCONTD: insulin lispro (HUMALOG) 100 UNIT/ML injection Inject 3-5 Units into the skin 3 (three) times daily before meals. Patient takes 3 units of Humalog at breakfast, 3units at [facility] and 5 units with dinner            Intrapartum Course:  This is a 36 year old G3 P0 020 at 26 weeks and 5 days with prenatal history significant for cervical incompetence. This current pregnancy was complicated by a 16 week pre-and viable delivery of baby A with cerclage placement at that  time. Patient presented morning of 07/10/2011 with rupture of membranes of baby B and occasional contractions. Given the risk of infectious complications as well as further damage to the cervix if patient was in active labor decision with the consultation of MFM was made to proceed with cerclage removal. Patient was aware of surgical risks including infection bleeding and damage to surrounding organs. Patient was also aware of the risk of imminent delivery once the cerclage was removed and the aware of the risks of prematurity. Footling breech presentation and advance cervical dilation noted after cerclage removal.   Procedures:  Cesarean section delivery of femal newborn by Dr  Ernestina Penna  See operative report for further details Newborn remains in NICU in stable but guarded condition due to extreme prematurity.  Postoperative / postpartum course: Uneventful. Patient was started on breast pumping immediately post-op, she remaines on Lantus insulin for tight glycemic control during healing of surgical incision.   Physical Exam:  VSS: Blood pressure 121/75, pulse 72, temperature 98.3 F (36.8 C), temperature source Oral, resp. rate 20, height 5\' 6"  (1.676 m), weight 119.75 kg (264 lb), last menstrual period 01/04/2011, SpO2 99.00%, unknown if currently breastfeeding.   LABS:   07/10/2011 09:15 07/11/2011 05:40  WBC 14.5 (H) 12.3 (H)  RBC 4.17 3.47 (L)  Hemoglobin 13.4 11.0 (L)  HCT 39.5 32.9 (L)  MCV 94.7 94.8  MCH 32.1 31.7  MCHC 33.9 33.4  RDW 13.4 13.6  Platelets 249 203   CBG (last 3)   Basename 07/14/11 1014 07/14/11 0601 07/13/11 2045  GLUCAP 86 79 120*      Incision:  approximated with staples / no erythema / no ecchymosis / no drainage Staples: intact for removal at office in 3 days  Discharge Instructions:  Discharged Condition: good Activity: pelvic rest and weight lifting and driving restrictions x 2 weeks Diet: routine Medications:  Medication List  As of 07/14/2011 10:23 AM   STOP taking these medications         insulin lispro 100 UNIT/ML injection         TAKE these medications         calcium carbonate 750 MG chewable tablet   Commonly known as: TUMS EX   Chew 1 tablet by mouth as needed.      docusate sodium 100 MG capsule   Commonly known as: COLACE   Take 100 mg by mouth daily.      ibuprofen 600 MG tablet   Commonly known as: ADVIL,MOTRIN   Take 1 tablet (600 mg total) by mouth every 6 (six) hours.      insulin glargine 100 UNIT/ML injection   Commonly known as: LANTUS   Inject 35 Units into the skin at bedtime.      levothyroxine 137 MCG tablet   Commonly known as: SYNTHROID, LEVOTHROID   Take 1 tablet (137 mcg  total) by mouth daily.      oxyCODONE-acetaminophen 5-325 MG per tablet   Commonly known as: PERCOCET   Take 1-2 tablets by mouth every 3 (three) hours as needed (moderate - severe pain).      prenatal multivitamin Tabs   Take 1 tablet by mouth daily.           Condition: stable Postpartum Instructions: refer to practice specific booklet Discharge to: home Disposition: 01-Home or Self Care Follow up :  Follow-up Information    Follow up with [facility] A[facility] MD. Schedule an appointment as soon as possible for a visit in 6 weeks.  Contact information:   [address] [place] [address] [phone]       Follow up with [facility]. Schedule an appointment as soon as possible for a visit in 3 days. (for staple removal)           Signed: Kaelob Persky 07/14/2011, 10:23 AM

## 2011-07-14 NOTE — Plan of Care (Signed)
Problem: Discharge Progression Outcomes Goal: Remove staples per MD order Outcome: Not Applicable Date Met:  07/14/11 Pt to go to office for removal of staples in 3 days.

## 2011-07-14 NOTE — Progress Notes (Signed)
Subjective: POD# 4 Information for the patient's newborn:  Sheila Evans, Sheila Evans [[id number]]  female    Reports feeling well, pumping good amounts of breastmilk Feeding: breast Patient reports tolerating PO.  Breast symptoms: milk is in. Pain controlled with Motrin and Percocet Denies HA/SOB/C/P/N/V/dizziness. Flatus present, no BM. She reports vaginal bleeding as normal, without clots.  She is ambulating, urinating without difficult.     Objective:   VS:  Filed Vitals:   07/13/11 1200 07/13/11 1800 07/13/11 2212 07/14/11 0605  BP: 105/72 132/82 120/79 121/75  Pulse: 76 85 73 72  Temp: 98.3 F (36.8 C) 98.4 F (36.9 C) 98.5 F (36.9 C) 98.3 F (36.8 C)  TempSrc: Oral Oral  Oral  Resp: 18 18 18 20   Height:      Weight:      SpO2: 96% 98% 97% 99%     CBG (last 3)   Basename 07/14/11 1014 07/14/11 0601 07/13/11 2045  GLUCAP 86 79 120*            Blood type: --/--/A POS (05/16 0915)  Rubella:       Physical Exam:  General: alert, cooperative, no distress and morbidly obese CV: Regular rate and rhythm Resp: clear Abdomen: soft, nontender, normal bowel sounds Incision: clean, dry, intact and staples intact, + panus, mild edema in panus. Uterine Fundus: firm, below umbilicus, nontender Lochia: minimal Ext: edema trace and Homans sign is negative, no sign of DVT      Assessment/Plan: 36 y.o.   POD# 4.  s/p Cesarean Delivery.  Indications: breech                Principal Problem:  *PP C/S 26w 5d 07/10/11 F (NICU) GDM - CBG stable, one time 144, otherwise well on diet and Lantus 20 at HS Continue current treatment, F/U w/ Dr. Talmage Nap as scheduled DC home w/ [facility] instructions, staple removal at [facility] in 3 days.   Infant stable in NICU  Christohper Dube 07/14/2011, 10:09 AM

## 2011-07-14 NOTE — Progress Notes (Signed)
Ur chart review completed.  

## 2011-07-16 ENCOUNTER — Encounter (HOSPITAL_COMMUNITY): Payer: Self-pay

## 2011-08-13 ENCOUNTER — Encounter (HOSPITAL_COMMUNITY)
Admission: RE | Admit: 2011-08-13 | Discharge: 2011-08-13 | Disposition: A | Discharge status: Home / Self Care | Payer: 59 | Source: Ambulatory Visit | Attending: Obstetrics | Admitting: Obstetrics

## 2011-08-13 DIAGNOSIS — O923 Agalactia: Secondary | ICD-10-CM | POA: Insufficient documentation

## 2011-09-13 ENCOUNTER — Encounter (HOSPITAL_COMMUNITY)
Admission: RE | Admit: 2011-09-13 | Discharge: 2011-09-13 | Disposition: A | Discharge status: Home / Self Care | Payer: 59 | Source: Ambulatory Visit | Attending: Obstetrics | Admitting: Obstetrics

## 2011-09-13 DIAGNOSIS — O923 Agalactia: Secondary | ICD-10-CM | POA: Insufficient documentation

## 2011-10-14 ENCOUNTER — Encounter (HOSPITAL_COMMUNITY)
Admission: RE | Admit: 2011-10-14 | Discharge: 2011-10-14 | Disposition: A | Discharge status: Home / Self Care | Payer: 59 | Source: Ambulatory Visit | Attending: Obstetrics | Admitting: Obstetrics

## 2011-10-14 DIAGNOSIS — O923 Agalactia: Secondary | ICD-10-CM | POA: Insufficient documentation

## 2011-11-14 ENCOUNTER — Encounter (HOSPITAL_COMMUNITY)
Admission: RE | Admit: 2011-11-14 | Discharge: 2011-11-14 | Disposition: A | Discharge status: Home / Self Care | Payer: Commercial Indemnity | Source: Ambulatory Visit | Attending: Obstetrics | Admitting: Obstetrics

## 2011-11-14 DIAGNOSIS — O923 Agalactia: Secondary | ICD-10-CM | POA: Insufficient documentation

## 2012-05-23 ENCOUNTER — Emergency Department (HOSPITAL_BASED_OUTPATIENT_CLINIC_OR_DEPARTMENT_OTHER)
Admission: EM | Admit: 2012-05-23 | Discharge: 2012-05-23 | Disposition: A | Discharge status: Home / Self Care | Payer: Commercial Indemnity | Attending: Emergency Medicine | Admitting: Emergency Medicine

## 2012-05-23 ENCOUNTER — Encounter (HOSPITAL_BASED_OUTPATIENT_CLINIC_OR_DEPARTMENT_OTHER): Payer: Self-pay

## 2012-05-23 DIAGNOSIS — Z87891 Personal history of nicotine dependence: Secondary | ICD-10-CM | POA: Insufficient documentation

## 2012-05-23 DIAGNOSIS — Z8632 Personal history of gestational diabetes: Secondary | ICD-10-CM | POA: Insufficient documentation

## 2012-05-23 DIAGNOSIS — S8392XA Sprain of unspecified site of left knee, initial encounter: Secondary | ICD-10-CM

## 2012-05-23 DIAGNOSIS — Z8742 Personal history of other diseases of the female genital tract: Secondary | ICD-10-CM | POA: Insufficient documentation

## 2012-05-23 DIAGNOSIS — I10 Essential (primary) hypertension: Secondary | ICD-10-CM | POA: Insufficient documentation

## 2012-05-23 DIAGNOSIS — E039 Hypothyroidism, unspecified: Secondary | ICD-10-CM | POA: Insufficient documentation

## 2012-05-23 DIAGNOSIS — Y9301 Activity, walking, marching and hiking: Secondary | ICD-10-CM | POA: Insufficient documentation

## 2012-05-23 DIAGNOSIS — W108XXA Fall (on) (from) other stairs and steps, initial encounter: Secondary | ICD-10-CM | POA: Insufficient documentation

## 2012-05-23 DIAGNOSIS — IMO0002 Reserved for concepts with insufficient information to code with codable children: Secondary | ICD-10-CM | POA: Insufficient documentation

## 2012-05-23 DIAGNOSIS — Z79899 Other long term (current) drug therapy: Secondary | ICD-10-CM | POA: Insufficient documentation

## 2012-05-23 DIAGNOSIS — Y929 Unspecified place or not applicable: Secondary | ICD-10-CM | POA: Insufficient documentation

## 2012-05-23 NOTE — ED Notes (Signed)
Pt states that she fell down the stairs this morning, injuring her L leg and L knee.  Pt states that she did not sustain any head or back injuries, no LOC.  PMS intact, incr pain with ambulation, wb, or movement.

## 2012-05-23 NOTE — ED Provider Notes (Signed)
History     CSN: [id number]  Arrival date & time 05/23/12  0915   First MD Initiated Contact with Patient 05/23/12 [phone]      Chief Complaint  Patient presents with  . Fall  . Knee Pain    Patient is a 37 y.o. female presenting with fall and knee pain. The history is provided by the patient.  Fall The accident occurred less than 1 hour ago. The fall occurred while walking. The point of impact was the left knee. The pain is present in the left knee. The pain is mild. She was ambulatory at the scene. There was no entrapment after the fall. Pertinent negatives include no headaches, no loss of consciousness and no tingling. The symptoms are aggravated by activity and standing. She has tried rest for the symptoms. The treatment provided mild relief.  Knee Pain Location:  Knee Injury: yes   Knee location:  L knee Pain details:    Quality:  Aching   Timing:  Constant   Progression:  Worsening Chronicity:  New Dislocation: no   Prior injury to area:  No Relieved by:  Rest Associated symptoms: no back pain, no muscle weakness and no neck pain     Past Medical History  Diagnosis Date  . Headache     teenager  . Hypertension   . Hypothyroid   . Abnormal Pap smear     leep, 3 normal since  . Infertility, female   . Gestational diabetes   . PP C/S 26w 5d 07/10/11 F (NICU) 07/11/2011    Past Surgical History  Procedure Laterality Date  . Leep    . Leep    . Tonsillectomy    . Cervical cerclage  04/18/2011    Procedure: CERCLAGE CERVICAL;  Surgeon: Tresa Endo A. Ernestina Penna, MD;  Location: [facility];  Service: Gynecology;  Laterality: N/A;  . Cervical cerclage  07/10/2011    Procedure: CERCLAGE CERVICAL;  Surgeon: Tresa Endo A. Ernestina Penna, MD;  Location: [facility];  Service: Gynecology;  Laterality: N/A;  removal  . Cesarean section  07/10/2011    Procedure: CESAREAN SECTION;  Surgeon: Tresa Endo A. Ernestina Penna, MD;  Location: [facility];  Service: Gynecology;  Laterality: N/A;    Family History  Problem  Relation Age of Onset  . Anesthesia problems Neg Hx     History  Substance Use Topics  . Smoking status: Former Games developer  . Smokeless tobacco: Never Used  . Alcohol Use: No    OB History   Grav Para Term Preterm Abortions TAB SAB Ect Mult Living   3 1 0 1 2 1 1 0 0 1       Review of Systems  HENT: Negative for neck pain.   Musculoskeletal: Negative for back pain.  Neurological: Negative for tingling, loss of consciousness and headaches.    Allergies  Latex  Home Medications   Current Outpatient Rx  Name  Route  Sig  Dispense  Refill  . calcium carbonate (TUMS EX) 750 MG chewable tablet   Oral   Chew 1 tablet by mouth as needed.         Marland Kitchen ibuprofen (ADVIL,MOTRIN) 600 MG tablet   Oral   Take 600 mg by mouth every 6 (six) hours as needed for pain.         Marland Kitchen docusate sodium (COLACE) 100 MG capsule   Oral   Take 100 mg by mouth daily.         . insulin glargine (LANTUS) 100 UNIT/ML injection  Subcutaneous   Inject 35 Units into the skin at bedtime.          Marland Kitchen EXPIRED: levothyroxine (SYNTHROID, LEVOTHROID) 137 MCG tablet   Oral   Take 1 tablet (137 mcg total) by mouth daily.   30 tablet   30   . Prenatal Vit-Fe Fumarate-FA (PRENATAL MULTIVITAMIN) TABS   Oral   Take 1 tablet by mouth daily.           BP 156/86  Pulse 81  Temp(Src) 98.9 F (37.2 C) (Oral)  Resp 16  Ht 5\' 6"  (1.676 m)  Wt 250 lb (113.399 kg)  BMI 40.37 kg/m2  SpO2 96%  LMP 05/20/2012  Breastfeeding? No  Physical Exam CONSTITUTIONAL: Well developed/well nourished HEAD: Normocephalic/atraumatic EYES: EOMI ENMT: Mucous membranes moist, No evidence of facial/nasal trauma NECK: supple no meningeal signs SPINE:entire spine nontender CV: S1/S2 noted, no murmurs/rubs/gallops noted LUNGS: Lungs are clear to auscultation bilaterally, no apparent distress ABDOMEN: soft, nontender, no rebound or guarding GU:no cva tenderness NEURO: Pt is awake/alert, moves all extremitiesx4.  Pt can  ambulate EXTREMITIES: pulses normal, full ROM. No patellar/fibular tenderness to left knee.  No swelling/bruising noted.  She can fully flex/extend left knee.  There is no ankle or foot tenderness on left LE.  Distal pulses intact.  She has mild tenderness to medial aspect of left knee.  No deformity noted All other extremities/joints palpated/ranged and nontender SKIN: warm, color normal PSYCH: no abnormalities of mood noted  ED Course  Procedures   1. Left knee sprain, initial encounter    Pt has bony tenderness.  She can ambulate and flex knee.  Doubt bony injury and do not feel xray needed.  Pt agreeable.  She requests knee brace.  I advised need for crutches and NWB to allow knee to heal, but she reports she will just "hop" around at home.  She reports this occurred while walking child down stairs (child is fine, no injury) Pt denies any head/neck or back injury   MDM  Nursing notes including past medical history and social history reviewed and considered in documentation         Joya Gaskins, MD 05/23/12 [phone]

## 2012-05-27 ENCOUNTER — Ambulatory Visit: Payer: Commercial Indemnity | Admitting: Family Medicine

## 2012-05-28 ENCOUNTER — Encounter: Payer: Self-pay | Admitting: Family Medicine

## 2012-05-28 ENCOUNTER — Ambulatory Visit (INDEPENDENT_AMBULATORY_CARE_PROVIDER_SITE_OTHER): Payer: Commercial Indemnity | Admitting: Family Medicine

## 2012-05-28 VITALS — BP 117/81 | HR 86 | Ht 66.0 in | Wt 250.0 lb

## 2012-05-28 DIAGNOSIS — M25569 Pain in unspecified knee: Secondary | ICD-10-CM

## 2012-05-28 DIAGNOSIS — M25562 Pain in left knee: Secondary | ICD-10-CM | POA: Insufficient documentation

## 2012-05-28 NOTE — Patient Instructions (Addendum)
You have sprained your MCL and could have a meniscus tear. Both are treated conservatively to begin with. Out of work until next Friday to allow for additional healing. Ibuprofen 600 mg three times a day with food for pain and inflammation. Icing 15 minutes at a time 3-4 times a day. ACE wrap as needed for compression and support. Consider knee brace if this helps with stability. Elevate above the level of your heart as needed for swelling also. Follow up with me in 4 weeks (or with Dr. Constance Goltz) for reevaluation.

## 2012-05-28 NOTE — Progress Notes (Signed)
Subjective:    Patient ID: Sheila Evans, female    DOB: 02/11/1976, 37 y.o.   MRN: [id number]  PCP: Dr. Riley Nearing  HPI 37 yo F here for left knee injury.  Patient reports on 3/30 she was at the top of the steps holding her daughter when she slipped and fell down the steps with left knee bent behind her. Daughter was ok but patient got up with pain in medial aspect of left knee along with bruising to left shin. Has been elevating, icing, using ace wrap, taking ibuprofen. Difficulty walking on left leg but has improved since the injury. No locking, giving out of knee since.  Feels like it is going to catch especially after prolonged sitting or getting up in the morning. No prior left knee injuries.  Past Medical History  Diagnosis Date  . Headache     teenager  . Hypertension   . Hypothyroid   . Abnormal Pap smear     leep, 3 normal since  . Infertility, female   . Gestational diabetes   . PP C/S 26w 5d 07/10/11 F (NICU) 07/11/2011    Current Outpatient Prescriptions on File Prior to Visit  Medication Sig Dispense Refill  . ibuprofen (ADVIL,MOTRIN) 600 MG tablet Take 600 mg by mouth every 6 (six) hours as needed for pain.      . calcium carbonate (TUMS EX) 750 MG chewable tablet Chew 1 tablet by mouth as needed.      . docusate sodium (COLACE) 100 MG capsule Take 100 mg by mouth daily.      . insulin glargine (LANTUS) 100 UNIT/ML injection Inject 35 Units into the skin at bedtime.       Marland Kitchen levothyroxine (SYNTHROID, LEVOTHROID) 137 MCG tablet Take 1 tablet (137 mcg total) by mouth daily.  30 tablet  30  . Prenatal Vit-Fe Fumarate-FA (PRENATAL MULTIVITAMIN) TABS Take 1 tablet by mouth daily.       No current facility-administered medications on file prior to visit.    Past Surgical History  Procedure Laterality Date  . Leep    . Leep    . Tonsillectomy    . Cervical cerclage  04/18/2011    Procedure: CERCLAGE CERVICAL;  Surgeon: Tresa Endo A. Ernestina Penna, MD;  Location: [facility];   Service: Gynecology;  Laterality: N/A;  . Cervical cerclage  07/10/2011    Procedure: CERCLAGE CERVICAL;  Surgeon: Tresa Endo A. Ernestina Penna, MD;  Location: [facility];  Service: Gynecology;  Laterality: N/A;  removal  . Cesarean section  07/10/2011    Procedure: CESAREAN SECTION;  Surgeon: Tresa Endo A. Ernestina Penna, MD;  Location: [facility];  Service: Gynecology;  Laterality: N/A;    Allergies  Allergen Reactions  . Latex Rash    History   Social History  . Marital Status: Married    Spouse Name: N/A    Number of Children: N/A  . Years of Education: N/A   Occupational History  . Not on file.   Social History Main Topics  . Smoking status: Former Games developer  . Smokeless tobacco: Never Used  . Alcohol Use: No  . Drug Use: No  . Sexually Active: Not Currently   Other Topics Concern  . Not on file   Social History Narrative  . No narrative on file    Family History  Problem Relation Age of Onset  . Anesthesia problems Neg Hx   . Hypertension Neg Hx   . Diabetes Mother   . Hyperlipidemia Mother   . Heart attack Father   .  Sudden death Father     BP 117/81  Pulse 86  Ht 5\' 6"  (1.676 m)  Wt 250 lb (113.399 kg)  BMI 40.37 kg/m2  LMP 05/20/2012  Review of Systems See HPI above.    Objective:   Physical Exam Gen: NAD  L knee: No effusion, bruising, other deformity. Medial joint line and MCL tenderness.  No lateral joint line, post patellar facet, other TTP about knee. FROM. Negative ant/post drawers. Pain but no laxity on valgus stress.  Negative varus testing. Negative lachmanns. Pain medially with mcmurrays, apleys.  Negative patellar apprehension, clarkes. NV intact distally.    Assessment & Plan:  1. Left knee pain - 2/2 MCL grade 1 sprain and meniscal contusion vs tear.  Start with conservative protocol.  Rest, out of work for 1 week.  Ibuprofen, icing, ace wrap or sleeve, elevation.  Expect she'll completely improve over next 2-4 weeks.  F/u in 4 weeks if she's still having  trouble.  Consider MRI.

## 2012-05-28 NOTE — Assessment & Plan Note (Signed)
2/2 MCL grade 1 sprain and meniscal contusion vs tear.  Start with conservative protocol.  Rest, out of work for 1 week.  Ibuprofen, icing, ace wrap or sleeve, elevation.  Expect she'll completely improve over next 2-4 weeks.  F/u in 4 weeks if she's still having trouble.  Consider MRI.

## 2012-06-14 ENCOUNTER — Encounter: Payer: Self-pay | Admitting: *Deleted

## 2012-06-28 ENCOUNTER — Ambulatory Visit (INDEPENDENT_AMBULATORY_CARE_PROVIDER_SITE_OTHER): Payer: Commercial Indemnity | Admitting: Internal Medicine

## 2012-06-28 ENCOUNTER — Encounter: Payer: Self-pay | Admitting: Internal Medicine

## 2012-06-28 ENCOUNTER — Ambulatory Visit (HOSPITAL_BASED_OUTPATIENT_CLINIC_OR_DEPARTMENT_OTHER)
Admission: RE | Admit: 2012-06-28 | Discharge: 2012-06-28 | Disposition: A | Discharge status: Home / Self Care | Payer: Commercial Indemnity | Source: Ambulatory Visit | Attending: Internal Medicine | Admitting: Internal Medicine

## 2012-06-28 VITALS — BP 135/82 | HR 84 | Temp 97.6°F | Resp 16 | Ht 66.0 in | Wt 268.0 lb

## 2012-06-28 DIAGNOSIS — I10 Essential (primary) hypertension: Secondary | ICD-10-CM

## 2012-06-28 DIAGNOSIS — Z8639 Personal history of other endocrine, nutritional and metabolic disease: Secondary | ICD-10-CM

## 2012-06-28 DIAGNOSIS — E079 Disorder of thyroid, unspecified: Secondary | ICD-10-CM

## 2012-06-28 DIAGNOSIS — Z1329 Encounter for screening for other suspected endocrine disorder: Secondary | ICD-10-CM

## 2012-06-28 DIAGNOSIS — L719 Rosacea, unspecified: Secondary | ICD-10-CM

## 2012-06-28 DIAGNOSIS — E049 Nontoxic goiter, unspecified: Secondary | ICD-10-CM | POA: Insufficient documentation

## 2012-06-28 DIAGNOSIS — Z87898 Personal history of other specified conditions: Secondary | ICD-10-CM

## 2012-06-28 DIAGNOSIS — Z8742 Personal history of other diseases of the female genital tract: Secondary | ICD-10-CM

## 2012-06-28 DIAGNOSIS — R51 Headache: Secondary | ICD-10-CM

## 2012-06-28 DIAGNOSIS — E039 Hypothyroidism, unspecified: Secondary | ICD-10-CM | POA: Insufficient documentation

## 2012-06-28 DIAGNOSIS — Z8632 Personal history of gestational diabetes: Secondary | ICD-10-CM | POA: Insufficient documentation

## 2012-06-28 DIAGNOSIS — Z862 Personal history of diseases of the blood and blood-forming organs and certain disorders involving the immune mechanism: Secondary | ICD-10-CM

## 2012-06-28 DIAGNOSIS — Z1321 Encounter for screening for nutritional disorder: Secondary | ICD-10-CM

## 2012-06-28 DIAGNOSIS — J309 Allergic rhinitis, unspecified: Secondary | ICD-10-CM | POA: Insufficient documentation

## 2012-06-28 DIAGNOSIS — O24419 Gestational diabetes mellitus in pregnancy, unspecified control: Secondary | ICD-10-CM

## 2012-06-28 DIAGNOSIS — O99810 Abnormal glucose complicating pregnancy: Secondary | ICD-10-CM

## 2012-06-28 DIAGNOSIS — R519 Headache, unspecified: Secondary | ICD-10-CM | POA: Insufficient documentation

## 2012-06-28 DIAGNOSIS — Z8669 Personal history of other diseases of the nervous system and sense organs: Secondary | ICD-10-CM

## 2012-06-28 LAB — COMPREHENSIVE METABOLIC PANEL
Albumin: 4.3 g/dL (ref 3.5–5.2)
Alkaline Phosphatase: 63 U/L (ref 39–117)
BUN: 9 mg/dL (ref 6–23)
CO2: 25 mEq/L (ref 19–32)
Calcium: 9.7 mg/dL (ref 8.4–10.5)
Chloride: 104 mEq/L (ref 96–112)
Glucose, Bld: 95 mg/dL (ref 70–99)
Potassium: 4.6 mEq/L (ref 3.5–5.3)
Sodium: 139 mEq/L (ref 135–145)
Total Protein: 7.3 g/dL (ref 6.0–8.3)

## 2012-06-28 LAB — LIPID PANEL
Cholesterol: 196 mg/dL (ref 0–200)
LDL Cholesterol: 96 mg/dL (ref 0–99)
Triglycerides: 288 mg/dL — ABNORMAL HIGH (ref <150)
VLDL: 58 mg/dL — ABNORMAL HIGH (ref 0–40)

## 2012-06-28 LAB — CBC WITH DIFFERENTIAL/PLATELET
HCT: 42 % (ref 36.0–46.0)
Hemoglobin: 14.9 g/dL (ref 12.0–15.0)
Lymphocytes Relative: 37 % (ref 12–46)
Lymphs Abs: 3 10*3/uL (ref 0.7–4.0)
MCHC: 35.5 g/dL (ref 30.0–36.0)
Monocytes Absolute: 0.7 10*3/uL (ref 0.1–1.0)
Monocytes Relative: 8 % (ref 3–12)
Neutro Abs: 4.1 10*3/uL (ref 1.7–7.7)
Neutrophils Relative %: 51 % (ref 43–77)
RBC: 4.6 MIL/uL (ref 3.87–5.11)

## 2012-06-28 NOTE — Patient Instructions (Addendum)
To have thyroid ultrasound today  Labs today  Activate my chart

## 2012-06-28 NOTE — Progress Notes (Signed)
Subjective:    Patient ID: Sheila Evans, female    DOB: June 07, 1975, 37 y.o.   MRN: [id number]  HPI Sheila Evans is a new pt here to establish care.  PMH of gestational diabetes,  Hypothyroidism in pregnancy, migraine headaches, allergic rhinitis ,  Recent L MCL sprain, and she has a history of abnormal pap.  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  This schedule works for her as she is home during the week with her daughter that was born 3 months premature.  Hope her daughter is doing fine.    Gestational diabetes  Pt reports she has not checked her sugars lately  "It is time to start to focus on my health now"  She did see Dr. Talmage Nap during her pregnancy but has not been back  Hyporthyroidism  She reports her TSH was 11 in 2011/2012  She has not had any thyroid meds in abaout a year.   She does have dry skin,  Cannot lose weight,  And fatigue    She does have rosacea and it bothers her eyes and ears.  She would like a dermatologist  Migraine headaches pt reports diagnosed in Childhood by Dr. 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 Latex Rash   Past Medical History  Diagnosis Date  . Headache     teenager  . Hypertension   . Hypothyroid   . Abnormal Pap smear     leep, 3 normal since  . Infertility, female   . Gestational diabetes   . PP C/S 26w 5d 07/10/11 F (NICU) 07/11/2011   Past Surgical History  Procedure Laterality Date  . Leep    . Leep    . Tonsillectomy    . Cervical cerclage  04/18/2011    Procedure: CERCLAGE CERVICAL;  Surgeon: Tresa Endo A. Ernestina Penna, MD;  Location: [facility];  Service: Gynecology;  Laterality: N/A;  . Cervical cerclage  07/10/2011    Procedure: CERCLAGE CERVICAL;  Surgeon: Tresa Endo A. Ernestina Penna, MD;  Location: [facility];  Service: Gynecology;  Laterality: N/A;  removal  . Cesarean section  07/10/2011   Procedure: CESAREAN SECTION;  Surgeon: Tresa Endo A. Ernestina Penna, MD;  Location: [facility];  Service: Gynecology;  Laterality: N/A;   History   Social History  . Marital Status: Married    Spouse Name: N/A    Number of Children: N/A  . Years of Education: N/A   Occupational History  . Not on file.   Social History Main Topics  . Smoking status: Former Games developer  . Smokeless tobacco: Never Used  . Alcohol Use: No  . Drug Use: No  . Sexually Active: Yes    Birth Control/ Protection: Surgical   Other Topics Concern  . Not on file   Social History Narrative  . No narrative on file   Family History  Problem Relation Age of Onset  . Anesthesia problems Neg Hx   . Hypertension Neg Hx   . Diabetes Mother   . Hyperlipidemia Mother   . Heart attack Father   . Sudden death Father    Patient Active Problem List   Diagnosis Date Noted  . Headache 06/28/2012  . Gestational diabetes 06/28/2012  . History of hypothyroidism 06/28/2012  . History of migraine headaches 06/28/2012  . History of abnormal Pap smear 06/28/2012  .  Allergic rhinitis 06/28/2012  . Rosacea 06/28/2012  . Left knee pain 05/28/2012  . PP C/S 26w 5d 07/10/11 F (NICU) 07/11/2011  . Twin pregnancy with fetal loss and retention of one fetus (14 wks) 04/17/2011   Current Outpatient Prescriptions on File Prior to Visit  Medication Sig Dispense Refill  . ibuprofen (ADVIL,MOTRIN) 600 MG tablet Take 600 mg by mouth every 6 (six) hours as needed for pain.      Marland Kitchen levothyroxine (SYNTHROID, LEVOTHROID) 137 MCG tablet Take 1 tablet (137 mcg total) by mouth daily.  30 tablet  30   No current facility-administered medications on file prior to visit.           Review of Systems    see HPI Objective:   Physical Exam Physical Exam  Nursing note and vitals reviewed.  Constitutional: She is oriented to person, place, and time. She appears well-developed and well-nourished.  HENT:  Head: Normocephalic and atraumatic.  Neck   Thyroid does feel slightly enlarged to me and there may be a nodule on the right Cardiovascular: Normal rate and regular rhythm. Exam reveals no gallop and no friction rub.  No murmur heard.  Pulmonary/Chest: Breath sounds normal. She has no wheezes. She has no rales.  Neurological: She is alert and oriented to person, place, and time.  Skin: Skin is warm and dry.  Face she has reddened maculopapular rash over nose and cheeks.   I do not see a rash on her eyes Psychiatric: She has a normal mood and affect. Her behavior is normal.        Assessment & Plan:  Hypothyroidism/ thyromegaly on exam  Will check TSH today and get thyroid ultrasound.    Further management based on results.  Gestational diabetes  Check labs [facility] today  further management based on results  Rosacea  Will refer to dematology  Allergic rhinitis  OTC med of choice  L knee MCL strain  Managed Dr. Pearletha Forge  History of migraine headaches  Relieved with Ibuprofen.  Vapor cigarette use

## 2012-06-29 LAB — VITAMIN D 25 HYDROXY (VIT D DEFICIENCY, FRACTURES): Vit D, 25-Hydroxy: 22 ng/mL — ABNORMAL LOW (ref 30–89)

## 2012-06-30 ENCOUNTER — Other Ambulatory Visit: Payer: Self-pay | Admitting: Internal Medicine

## 2012-06-30 ENCOUNTER — Telehealth: Payer: Self-pay | Admitting: Internal Medicine

## 2012-06-30 ENCOUNTER — Telehealth: Payer: Self-pay | Admitting: *Deleted

## 2012-06-30 ENCOUNTER — Encounter: Payer: Self-pay | Admitting: Internal Medicine

## 2012-06-30 DIAGNOSIS — E039 Hypothyroidism, unspecified: Secondary | ICD-10-CM | POA: Insufficient documentation

## 2012-06-30 DIAGNOSIS — E041 Nontoxic single thyroid nodule: Secondary | ICD-10-CM

## 2012-06-30 MED ORDER — LEVOTHYROXINE SODIUM 100 MCG PO TABS: 100.0000 ug | ORAL_TABLET | Freq: Every day | ORAL | Status: DC

## 2012-06-30 NOTE — Telephone Encounter (Signed)
Spoke with pt and informed of thyroid U/S and lab result.  I advised referral to endocrinologist but pt states this is nothing new for her as this was present in pregnancy.  With such a small nodule will repeat in one year and if any larger will get nuclear scan   Will start levothyroxine daily.   She has follow up with me in 3 weeks

## 2012-06-30 NOTE — Telephone Encounter (Signed)
Sent into [facility]

## 2012-07-20 ENCOUNTER — Encounter: Payer: Self-pay | Admitting: Internal Medicine

## 2012-07-20 ENCOUNTER — Ambulatory Visit (INDEPENDENT_AMBULATORY_CARE_PROVIDER_SITE_OTHER): Payer: BC Managed Care – PPO | Admitting: Internal Medicine

## 2012-07-20 VITALS — BP 133/86 | HR 73 | Temp 98.1°F | Resp 18 | Wt 267.0 lb

## 2012-07-20 DIAGNOSIS — D72829 Elevated white blood cell count, unspecified: Secondary | ICD-10-CM

## 2012-07-20 DIAGNOSIS — E781 Pure hyperglyceridemia: Secondary | ICD-10-CM

## 2012-07-20 DIAGNOSIS — E041 Nontoxic single thyroid nodule: Secondary | ICD-10-CM

## 2012-07-20 DIAGNOSIS — D649 Anemia, unspecified: Secondary | ICD-10-CM | POA: Insufficient documentation

## 2012-07-20 DIAGNOSIS — E039 Hypothyroidism, unspecified: Secondary | ICD-10-CM

## 2012-07-20 MED ORDER — INTEGRA 62.5-62.5-40-3 MG PO CAPS: ORAL_CAPSULE | ORAL | Status: DC

## 2012-07-20 NOTE — Progress Notes (Signed)
Subjective:    Patient ID: Sheila Evans, female    DOB: [birth year], 37 y.o.   MRN: [id number]  HPI Sheila Evans is here for follow up.   She is now taking her Synthroid daily.  Thyroid U/S reveals 4 x 7 mm nodule.    See labs.  She is slightly anemic and her WBC is elevated.   She reports she has had an elevated WBC since her fetal loss in 03/2011.   Her WBC is slightly lower now.  She does have cerclage.   See elevated Tg's   Allergies  Allergen Reactions  . Latex Rash   Past Medical History  Diagnosis Date  . Headache     teenager  . Hypertension   . Hypothyroid   . Abnormal Pap smear     leep, 3 normal since  . Infertility, female   . Gestational diabetes   . PP C/S 26w 5d 07/10/11 F (NICU) 07/11/2011   Past Surgical History  Procedure Laterality Date  . Leep    . Leep    . Tonsillectomy    . Cervical cerclage  04/18/2011    Procedure: CERCLAGE CERVICAL;  Surgeon: Tresa Endo A. Ernestina Penna, MD;  Location: [facility];  Service: Gynecology;  Laterality: N/A;  . Cervical cerclage  07/10/2011    Procedure: CERCLAGE CERVICAL;  Surgeon: Tresa Endo A. Ernestina Penna, MD;  Location: [facility];  Service: Gynecology;  Laterality: N/A;  removal  . Cesarean section  07/10/2011    Procedure: CESAREAN SECTION;  Surgeon: Tresa Endo A. Ernestina Penna, MD;  Location: [facility];  Service: Gynecology;  Laterality: N/A;   History   Social History  . Marital Status: Married    Spouse Name: N/A    Number of Children: N/A  . Years of Education: N/A   Occupational History  . Not on file.   Social History Main Topics  . Smoking status: Former Games developer  . Smokeless tobacco: Never Used  . Alcohol Use: No  . Drug Use: No  . Sexually Active: Yes    Birth Control/ Protection: Surgical   Other Topics Concern  . Not on file   Social History Narrative  . No narrative on file   Family History  Problem Relation Age of Onset  . Anesthesia problems Neg Hx   . Hypertension Neg Hx   . Diabetes Mother   . Hyperlipidemia Mother   .  Heart attack Father   . Sudden death Father    Patient Active Problem List   Diagnosis Date Noted  . Anemia 07/20/2012  . Elevated WBC count 07/20/2012  . Hypertriglyceridemia 07/20/2012  . Unspecified hypothyroidism 06/30/2012  . Thyroid nodule 06/30/2012  . Headache 06/28/2012  . Gestational diabetes 06/28/2012  . History of hypothyroidism 06/28/2012  . History of migraine headaches 06/28/2012  . History of abnormal Pap smear 06/28/2012  . Allergic rhinitis 06/28/2012  . Rosacea 06/28/2012  . Left knee pain 05/28/2012  . PP C/S 26w 5d 07/10/11 F (NICU) 07/11/2011  . Twin pregnancy with fetal loss and retention of one fetus (14 wks) 04/17/2011   Current Outpatient Prescriptions on File Prior to Visit  Medication Sig Dispense Refill  . ibuprofen (ADVIL,MOTRIN) 600 MG tablet Take 600 mg by mouth every 6 (six) hours as needed for pain.      Marland Kitchen levothyroxine (SYNTHROID, LEVOTHROID) 100 MCG tablet Take 1 tablet (100 mcg total) by mouth daily.  90 tablet  3   No current facility-administered medications on file prior to visit.  Review of Systems See HPI    Objective:   Physical Exam Physical Exam  Nursing note and vitals reviewed.  Constitutional: She is oriented to person, place, and time. She appears well-developed and well-nourished.  HENT:  Head: Normocephalic and atraumatic.  Cardiovascular: Normal rate and regular rhythm. Exam reveals no gallop and no friction rub.  No murmur heard.  Pulmonary/Chest: Breath sounds normal. She has no wheezes. She has no rales.  Neurological: She is alert and oriented to person, place, and time.  Skin: Skin is warm and dry.  Psychiatric: She has a normal mood and affect. Her behavior is normal.             Assessment & Plan:  Hypothryoidism  Will need repeat TSH in 2-3 months  Thyroid nodule   4 x 7 mm will need repeat in one year  06/2013  Elevated WBC  Improving.   Likely secondary to retained placenta after fetal loss   Dr. Ernestina Penna following  Hypertriglyceridemia  Given copy of DASH diet

## 2012-07-20 NOTE — Patient Instructions (Addendum)
See me in 3 months

## 2012-10-13 ENCOUNTER — Telehealth: Payer: Self-pay | Admitting: *Deleted

## 2012-10-13 NOTE — Telephone Encounter (Signed)
Needs TSH drawn. Patient will come by 11:00 am on Thursday to pick up lab order.

## 2012-10-14 ENCOUNTER — Other Ambulatory Visit: Payer: Self-pay | Admitting: *Deleted

## 2012-10-14 DIAGNOSIS — E041 Nontoxic single thyroid nodule: Secondary | ICD-10-CM

## 2012-10-14 DIAGNOSIS — Z8639 Personal history of other endocrine, nutritional and metabolic disease: Secondary | ICD-10-CM

## 2012-10-14 DIAGNOSIS — D649 Anemia, unspecified: Secondary | ICD-10-CM

## 2012-10-14 LAB — BASIC METABOLIC PANEL
Calcium: 9.8 mg/dL (ref 8.4–10.5)
Creat: 0.75 mg/dL (ref 0.50–1.10)
Sodium: 134 mEq/L — ABNORMAL LOW (ref 135–145)

## 2012-10-14 LAB — TSH: TSH: 8.928 u[IU]/mL — ABNORMAL HIGH (ref 0.350–4.500)

## 2012-10-18 ENCOUNTER — Encounter: Payer: Self-pay | Admitting: *Deleted

## 2012-10-18 ENCOUNTER — Other Ambulatory Visit: Payer: Self-pay | Admitting: Internal Medicine

## 2012-10-18 ENCOUNTER — Telehealth: Payer: Self-pay | Admitting: *Deleted

## 2012-10-18 MED ORDER — LEVOTHYROXINE SODIUM 125 MCG PO TABS: 125.0000 ug | ORAL_TABLET | Freq: Every day | ORAL | Status: DC

## 2012-10-18 NOTE — Telephone Encounter (Signed)
Notified pt of new med change appt moved up to 0845 9/3 due to fasting

## 2012-10-18 NOTE — Telephone Encounter (Signed)
Message copied by Mathews Robinsons on Mon Oct 18, 2012  4:21 PM ------      Message from: Raechel Chute D      Created: Mon Oct 18, 2012  2:58 PM       [name]            Call pt and tell her that I need to adjust her thyroid medication to 125 mcg daily and also that her glucose is elevated.            Tell her to sure to [facility] her appt in September with me and to come in fasting as I will recheck her glucose            I will e-scirbe new dose of thyroid med            Ok to mail labs to her       ------

## 2012-10-27 ENCOUNTER — Ambulatory Visit: Payer: BC Managed Care – PPO | Admitting: Internal Medicine

## 2012-10-27 ENCOUNTER — Ambulatory Visit (INDEPENDENT_AMBULATORY_CARE_PROVIDER_SITE_OTHER): Payer: Commercial Indemnity | Admitting: Internal Medicine

## 2012-10-27 ENCOUNTER — Encounter: Payer: Self-pay | Admitting: Internal Medicine

## 2012-10-27 VITALS — BP 121/75 | HR 67 | Temp 98.6°F | Resp 18 | Wt 274.0 lb

## 2012-10-27 DIAGNOSIS — E039 Hypothyroidism, unspecified: Secondary | ICD-10-CM

## 2012-10-27 DIAGNOSIS — R7989 Other specified abnormal findings of blood chemistry: Secondary | ICD-10-CM

## 2012-10-27 DIAGNOSIS — E041 Nontoxic single thyroid nodule: Secondary | ICD-10-CM

## 2012-10-27 DIAGNOSIS — F4323 Adjustment disorder with mixed anxiety and depressed mood: Secondary | ICD-10-CM | POA: Insufficient documentation

## 2012-10-27 DIAGNOSIS — R7309 Other abnormal glucose: Secondary | ICD-10-CM

## 2012-10-27 DIAGNOSIS — R739 Hyperglycemia, unspecified: Secondary | ICD-10-CM

## 2012-10-27 LAB — POCT CBG (FASTING - GLUCOSE)-MANUAL ENTRY: Glucose Fasting, POC: 128 mg/dL — AB (ref 70–99)

## 2012-10-27 LAB — COMPREHENSIVE METABOLIC PANEL
ALT: 98 U/L — ABNORMAL HIGH (ref 0–35)
AST: 71 U/L — ABNORMAL HIGH (ref 0–37)
Albumin: 4 g/dL (ref 3.5–5.2)
Alkaline Phosphatase: 56 U/L (ref 39–117)
BUN: 11 mg/dL (ref 6–23)
Chloride: 103 mEq/L (ref 96–112)
Potassium: 5 mEq/L (ref 3.5–5.3)
Sodium: 137 mEq/L (ref 135–145)

## 2012-10-27 LAB — HEMOGLOBIN A1C: Mean Plasma Glucose: 140 mg/dL — ABNORMAL HIGH (ref <117)

## 2012-10-27 MED ORDER — ESCITALOPRAM OXALATE 5 MG PO TABS: ORAL_TABLET | ORAL | Status: DC

## 2012-10-27 NOTE — Patient Instructions (Addendum)
See me in december 

## 2012-10-27 NOTE — Progress Notes (Signed)
Subjective:    Patient ID: Sheila Evans, female    DOB: 03-17-1975, 37 y.o.   MRN: [id number]  HPI  Sheila Evans is here for follow up on several issues.  See TSH  She tells me she has not been taking her synthroid daily.  She recently changed dose to  125 mcg just a few days ago  Fasting glucose today  128  Aic was 5.8 4 months ago.  Strong FH of diabetes  See lfts  She reports that she has known elevated lfts dating back to 2010 when she was seeing Dr. Alvera Novel.  She does not recall having liver U/S or hepatitis panel done back then  She is tearful most of interview  Lots of stress with work at [employer] care.  She works weekends but many times work spills over to the week.  Stress with finances and relationship with husband.  Daughter brings her joy  "she is only thing that makes me happy/  She reports more anxiety than depressed mood. No SI/intent/plan  She would like to try Lexapro.  She will be starting an exercise program.  She tells me she cannot affort weight watchers.  She declines seeing a nutritionist for diabetes.  She declines a therapist at this time  Allergies  Allergen Reactions  . Latex Rash   Past Medical History  Diagnosis Date  . Headache(784.0)     teenager  . Hypertension   . Hypothyroid   . Abnormal Pap smear     leep, 3 normal since  . Infertility, female   . Gestational diabetes   . PP C/S 26w 5d 07/10/11 F (NICU) 07/11/2011   Past Surgical History  Procedure Laterality Date  . Leep    . Leep    . Tonsillectomy    . Cervical cerclage  04/18/2011    Procedure: CERCLAGE CERVICAL;  Surgeon: Tresa Endo A. Ernestina Penna, MD;  Location: [facility];  Service: Gynecology;  Laterality: N/A;  . Cervical cerclage  07/10/2011    Procedure: CERCLAGE CERVICAL;  Surgeon: Tresa Endo A. Ernestina Penna, MD;  Location: [facility];  Service: Gynecology;  Laterality: N/A;  removal  . Cesarean section  07/10/2011    Procedure: CESAREAN SECTION;  Surgeon: Tresa Endo A. Ernestina Penna, MD;  Location: [facility];   Service: Gynecology;  Laterality: N/A;   History   Social History  . Marital Status: Married    Spouse Name: N/A    Number of Children: N/A  . Years of Education: N/A   Occupational History  . Not on file.   Social History Main Topics  . Smoking status: Former Games developer  . Smokeless tobacco: Never Used  . Alcohol Use: No  . Drug Use: No  . Sexual Activity: Yes    Birth Control/ Protection: Surgical   Other Topics Concern  . Not on file   Social History Narrative  . No narrative on file   Family History  Problem Relation Age of Onset  . Anesthesia problems Neg Hx   . Hypertension Neg Hx   . Diabetes Mother   . Hyperlipidemia Mother   . Heart attack Father   . Sudden death Father    Patient Active Problem List   Diagnosis Date Noted  . Anemia 07/20/2012  . Elevated WBC count 07/20/2012  . Hypertriglyceridemia 07/20/2012  . Unspecified hypothyroidism 06/30/2012  . Thyroid nodule 06/30/2012  . Headache(784.0) 06/28/2012  . Gestational diabetes 06/28/2012  . History of hypothyroidism 06/28/2012  . History of migraine headaches 06/28/2012  . History  of abnormal Pap smear 06/28/2012  . Allergic rhinitis 06/28/2012  . Rosacea 06/28/2012  . Left knee pain 05/28/2012  . PP C/S 26w 5d 07/10/11 F (NICU) 07/11/2011  . Twin pregnancy with fetal loss and retention of one fetus (14 wks) 04/17/2011   Current Outpatient Prescriptions on File Prior to Visit  Medication Sig Dispense Refill  . doxycycline (ORACEA) 40 MG capsule Take 40 mg by mouth every morning.      Marland Kitchen levothyroxine (SYNTHROID, LEVOTHROID) 125 MCG tablet Take 1 tablet (125 mcg total) by mouth daily.  90 tablet  3  . metroNIDAZOLE (METROCREAM) 0.75 % cream Apply 1 application topically 2 (two) times daily.      . Sulfacetamide Sodium-Sulfur (SUMADAN WASH) 9-4.5 % LIQD Apply topically.      . Fe Fum-FePoly-Vit C-Vit B3 (INTEGRA) 62.5-62.5-40-3 MG CAPS Take one daily  30 capsule  3  . ibuprofen (ADVIL,MOTRIN) 600 MG  tablet Take 600 mg by mouth every 6 (six) hours as needed for pain.       No current facility-administered medications on file prior to visit.      Review of Systems See HPI    Objective:   Physical Exam Physical Exam  Nursing note and vitals reviewed.  Constitutional: She is oriented to person, place, and time. She appears well-developed and well-nourished.  HENT:  Head: Normocephalic and atraumatic.  Cardiovascular: Normal rate and regular rhythm. Exam reveals no gallop and no friction rub.  No murmur heard.  Pulmonary/Chest: Breath sounds normal. She has no wheezes. She has no rales.  Neurological: She is alert and oriented to person, place, and time.  Skin: Skin is warm and dry.  Psychiatric: She has a normal mood and affect. Her behavior is normal.              Assessment & Plan:  Borderline diabetes  Will get AIC today fasting glucose 128 in office.  She declines nutritional counseling despite my recommendation  hypothyroidism  Check TSH in December  Not at goal yet  Mixed anxiety and depressed mood.  Will start Lexapro 5 mg daily for 2 weeks then 10 mg  Morbid obesity  Will start exercise program.   Thyroid not at goal as yet  Elevated lfts  Will check hepatitis panel.  She does not want imaging at this time due to fianances  See me in December  Encouraged to call if any problem

## 2012-10-28 LAB — HEPATITIS PANEL, ACUTE: Hepatitis B Surface Ag: NEGATIVE

## 2012-11-01 ENCOUNTER — Telehealth: Payer: Self-pay | Admitting: *Deleted

## 2012-11-01 NOTE — Telephone Encounter (Signed)
Message copied by Mathews Robinsons on Mon Nov 01, 2012  1:12 PM ------      Message from: Raechel Chute D      Created: Mon Nov 01, 2012  8:12 AM       [name]            Call pt and let her know that her sugar is elevated and by her [facility] she meets criteria for Type II diabetes            Her liver function is still elevated and I need to see her in office.  I will need to get a liver ultrasound.   OK to tell her that her hepatitis panel is negative.  If she wants to get a liver ultrasound prior to visit let me know            Give her a 30 min appt with me.               ------

## 2012-11-01 NOTE — Telephone Encounter (Signed)
Pt will schedule an appt later this week.

## 2012-11-03 ENCOUNTER — Encounter: Payer: Self-pay | Admitting: Internal Medicine

## 2012-11-03 DIAGNOSIS — Z9889 Other specified postprocedural states: Secondary | ICD-10-CM | POA: Insufficient documentation

## 2012-11-03 DIAGNOSIS — A630 Anogenital (venereal) warts: Secondary | ICD-10-CM | POA: Insufficient documentation

## 2012-11-08 ENCOUNTER — Telehealth: Payer: Self-pay | Admitting: *Deleted

## 2012-11-08 DIAGNOSIS — R748 Abnormal levels of other serum enzymes: Secondary | ICD-10-CM

## 2012-11-08 NOTE — Telephone Encounter (Signed)
appt for Next week order for [place] entered per Dr Constance Goltz

## 2012-11-11 ENCOUNTER — Ambulatory Visit (HOSPITAL_BASED_OUTPATIENT_CLINIC_OR_DEPARTMENT_OTHER)
Admission: RE | Admit: 2012-11-11 | Discharge: 2012-11-11 | Disposition: A | Discharge status: Home / Self Care | Payer: BC Managed Care – PPO | Source: Ambulatory Visit | Attending: Internal Medicine | Admitting: Internal Medicine

## 2012-11-11 DIAGNOSIS — K7689 Other specified diseases of liver: Secondary | ICD-10-CM | POA: Insufficient documentation

## 2012-11-11 DIAGNOSIS — R7989 Other specified abnormal findings of blood chemistry: Secondary | ICD-10-CM | POA: Insufficient documentation

## 2012-11-11 DIAGNOSIS — R748 Abnormal levels of other serum enzymes: Secondary | ICD-10-CM

## 2012-11-15 ENCOUNTER — Telehealth: Payer: Self-pay | Admitting: Internal Medicine

## 2012-11-15 NOTE — Telephone Encounter (Signed)
Notified pt of liver US results she has an appt 9/23

## 2012-11-15 NOTE — Telephone Encounter (Signed)
Sheila Evans  Call pt and let her know that her liver ultrasound shows fatty liver.  Advise her to keep her follow up appt with me this week

## 2012-11-16 ENCOUNTER — Ambulatory Visit (INDEPENDENT_AMBULATORY_CARE_PROVIDER_SITE_OTHER): Payer: Commercial Indemnity | Admitting: Internal Medicine

## 2012-11-16 ENCOUNTER — Encounter: Payer: Self-pay | Admitting: Internal Medicine

## 2012-11-16 VITALS — BP 125/84 | HR 81 | Temp 98.3°F | Resp 18 | Wt 272.0 lb

## 2012-11-16 DIAGNOSIS — R7303 Prediabetes: Secondary | ICD-10-CM | POA: Insufficient documentation

## 2012-11-16 DIAGNOSIS — R7309 Other abnormal glucose: Secondary | ICD-10-CM

## 2012-11-16 DIAGNOSIS — F4323 Adjustment disorder with mixed anxiety and depressed mood: Secondary | ICD-10-CM

## 2012-11-16 DIAGNOSIS — K7689 Other specified diseases of liver: Secondary | ICD-10-CM

## 2012-11-16 DIAGNOSIS — K760 Fatty (change of) liver, not elsewhere classified: Secondary | ICD-10-CM | POA: Insufficient documentation

## 2012-11-16 DIAGNOSIS — Z23 Encounter for immunization: Secondary | ICD-10-CM

## 2012-11-16 DIAGNOSIS — R7989 Other specified abnormal findings of blood chemistry: Secondary | ICD-10-CM

## 2012-11-16 DIAGNOSIS — R739 Hyperglycemia, unspecified: Secondary | ICD-10-CM | POA: Insufficient documentation

## 2012-11-16 NOTE — Patient Instructions (Addendum)
See me as needed 

## 2012-11-16 NOTE — Progress Notes (Signed)
Subjective:    Patient ID: Sheila Evans, female    DOB: [birth year], 37 y.o.   MRN: [id number]  HPI [name] is here for follow up  She is smiling today and states the lexapro has helped her mood and circumstances in her life are better.  She will be getting her own car, husband off pain meds for his back - things at home are better.   See labs she has diabetes levels of her glucose and AIC.  She asked me 3 times during interview to allow her to try a weight loss program called "[facility]" that involves low carb diet.  They also give her SL HCG and B12.  She is walking and will start swimming soon.  She has lost 2 lbs in the first 2-3 weeks.  She repeatedly declines meds or diabetes education now.  Program apparently MD supervised  See liver u/s  Has hepatic steatosisd and hepatitis profile in negative  Allergies  Allergen Reactions  . Latex Rash   Past Medical History  Diagnosis Date  . Headache(784.0)     teenager  . Hypertension   . Hypothyroid   . Abnormal Pap smear     leep, 3 normal since  . Infertility, female   . Gestational diabetes   . PP C/S 26w 5d 07/10/11 F (NICU) 07/11/2011   Past Surgical History  Procedure Laterality Date  . Leep    . Leep    . Tonsillectomy    . Cervical cerclage  04/18/2011    Procedure: CERCLAGE CERVICAL;  Surgeon: Tresa Endo A. Ernestina Penna, MD;  Location: [facility];  Service: Gynecology;  Laterality: N/A;  . Cervical cerclage  07/10/2011    Procedure: CERCLAGE CERVICAL;  Surgeon: Tresa Endo A. Ernestina Penna, MD;  Location: [facility];  Service: Gynecology;  Laterality: N/A;  removal  . Cesarean section  07/10/2011    Procedure: CESAREAN SECTION;  Surgeon: Tresa Endo A. Ernestina Penna, MD;  Location: [facility];  Service: Gynecology;  Laterality: N/A;   History   Social History  . Marital Status: Married    Spouse Name: N/A    Number of Children: N/A  . Years of Education: N/A   Occupational History  . Not on file.   Social History Main Topics  . Smoking status: Former  Games developer  . Smokeless tobacco: Never Used  . Alcohol Use: No  . Drug Use: No  . Sexual Activity: Yes    Birth Control/ Protection: Surgical   Other Topics Concern  . Not on file   Social History Narrative  . No narrative on file   Family History  Problem Relation Age of Onset  . Anesthesia problems Neg Hx   . Hypertension Neg Hx   . Diabetes Mother   . Hyperlipidemia Mother   . Heart attack Father   . Sudden death Father    Patient Active Problem List   Diagnosis Date Noted  . Hyperglycemia 11/16/2012  . Borderline diabetes 11/16/2012  . Hepatic steatosis 11/16/2012  . Condyloma 11/03/2012  . S/P LEEP 11/03/2012  . Elevated LFTs 10/27/2012  . Adjustment disorder with mixed anxiety and depressed mood 10/27/2012  . Morbid obesity 10/27/2012  . Anemia 07/20/2012  . Elevated WBC count 07/20/2012  . Hypertriglyceridemia 07/20/2012  . Unspecified hypothyroidism 06/30/2012  . Thyroid nodule 06/30/2012  . Headache(784.0) 06/28/2012  . Gestational diabetes 06/28/2012  . History of hypothyroidism 06/28/2012  . History of migraine headaches 06/28/2012  . History of abnormal Pap smear 06/28/2012  . Allergic rhinitis 06/28/2012  .  Rosacea 06/28/2012  . Left knee pain 05/28/2012  . PP C/S 26w 5d 07/10/11 F (NICU) 07/11/2011  . Twin pregnancy with fetal loss and retention of one fetus (14 wks) 04/17/2011   Current Outpatient Prescriptions on File Prior to Visit  Medication Sig Dispense Refill  . doxycycline (ORACEA) 40 MG capsule Take 40 mg by mouth every morning.      . escitalopram (LEXAPRO) 5 MG tablet Take one 5 mg tablet nightl for 2 weeks then 2 tabs nightly  60 tablet  1  . Fe Fum-FePoly-Vit C-Vit B3 (INTEGRA) 62.5-62.5-40-3 MG CAPS Take one daily  30 capsule  3  . ibuprofen (ADVIL,MOTRIN) 600 MG tablet Take 600 mg by mouth every 6 (six) hours as needed for pain.      Marland Kitchen levothyroxine (SYNTHROID, LEVOTHROID) 125 MCG tablet Take 1 tablet (125 mcg total) by mouth daily.  90  tablet  3  . metroNIDAZOLE (METROCREAM) 0.75 % cream Apply 1 application topically 2 (two) times daily.      . Sulfacetamide Sodium-Sulfur (SUMADAN WASH) 9-4.5 % LIQD Apply topically.       No current facility-administered medications on file prior to visit.       Review of Systems    see HPI Objective:   Physical Exam Physical Exam  Nursing note and vitals reviewed.  Constitutional: She is oriented to person, place, and time. She appears well-developed and well-nourished.  HENT:  Head: Normocephalic and atraumatic.  Cardiovascular: Normal rate and regular rhythm. Exam reveals no gallop and no friction rub.  No murmur heard.  Pulmonary/Chest: Breath sounds normal. She has no wheezes. She has no rales.  Neurological: She is alert and oriented to person, place, and time.  Skin: Skin is warm and dry.  Psychiatric: She has a normal mood and affect. Her behavior is normal.        Assessment & Plan:  Borderline Diabetes  She declines  meds or diabetes referral repeatedly despite my advice.  I am willing to give her a 3 month trial of this program that is MD supervised.    Elevated lfts hepatic steatosis  Hopefully will improve with weight loss.  Will check at her next visit.  Hepatitis neg  Situational depression  Improved  Continue lexapro for now  See me in December  Flu vaccine today

## 2012-11-26 ENCOUNTER — Telehealth: Payer: Self-pay | Admitting: *Deleted

## 2012-11-26 DIAGNOSIS — R739 Hyperglycemia, unspecified: Secondary | ICD-10-CM

## 2012-11-26 MED ORDER — GLUCOSE BLOOD VI STRP: ORAL_STRIP | Status: DC

## 2012-11-26 NOTE — Telephone Encounter (Signed)
Refill test strips

## 2012-12-08 ENCOUNTER — Other Ambulatory Visit: Payer: Self-pay | Admitting: *Deleted

## 2012-12-09 ENCOUNTER — Other Ambulatory Visit: Payer: Self-pay | Admitting: *Deleted

## 2012-12-09 MED ORDER — ACCU-CHEK AVIVA PLUS W/DEVICE KIT: 1.0000 | PACK | Freq: Two times a day (BID) | Status: DC

## 2012-12-09 NOTE — Telephone Encounter (Signed)
Refill request

## 2012-12-17 ENCOUNTER — Telehealth: Payer: Self-pay | Admitting: *Deleted

## 2012-12-17 NOTE — Telephone Encounter (Signed)
Pt called and states that she has lost 30 lbs

## 2012-12-20 NOTE — Telephone Encounter (Signed)
Pt calls stating that she has lost 30 lbs and her BS have been WNL

## 2013-01-25 ENCOUNTER — Other Ambulatory Visit: Payer: Self-pay | Admitting: Internal Medicine

## 2013-01-25 ENCOUNTER — Encounter: Payer: Self-pay | Admitting: Internal Medicine

## 2013-01-25 ENCOUNTER — Ambulatory Visit (INDEPENDENT_AMBULATORY_CARE_PROVIDER_SITE_OTHER): Payer: Commercial Indemnity | Admitting: Internal Medicine

## 2013-01-25 VITALS — BP 115/72 | HR 86 | Temp 98.1°F | Resp 18 | Wt 235.0 lb

## 2013-01-25 DIAGNOSIS — R7989 Other specified abnormal findings of blood chemistry: Secondary | ICD-10-CM

## 2013-01-25 DIAGNOSIS — K760 Fatty (change of) liver, not elsewhere classified: Secondary | ICD-10-CM

## 2013-01-25 DIAGNOSIS — R7303 Prediabetes: Secondary | ICD-10-CM

## 2013-01-25 DIAGNOSIS — R7309 Other abnormal glucose: Secondary | ICD-10-CM | POA: Diagnosis not present

## 2013-01-25 DIAGNOSIS — R739 Hyperglycemia, unspecified: Secondary | ICD-10-CM

## 2013-01-25 DIAGNOSIS — K7689 Other specified diseases of liver: Secondary | ICD-10-CM

## 2013-01-25 DIAGNOSIS — F4323 Adjustment disorder with mixed anxiety and depressed mood: Secondary | ICD-10-CM | POA: Diagnosis not present

## 2013-01-25 DIAGNOSIS — E781 Pure hyperglyceridemia: Secondary | ICD-10-CM

## 2013-01-25 LAB — COMPREHENSIVE METABOLIC PANEL
ALT: 47 U/L — ABNORMAL HIGH (ref 0–35)
AST: 34 U/L (ref 0–37)
BUN: 11 mg/dL (ref 6–23)
CO2: 26 mEq/L (ref 19–32)
Chloride: 104 mEq/L (ref 96–112)
Creat: 0.74 mg/dL (ref 0.50–1.10)
Glucose, Bld: 106 mg/dL — ABNORMAL HIGH (ref 70–99)
Sodium: 138 mEq/L (ref 135–145)
Total Bilirubin: 1 mg/dL (ref 0.3–1.2)
Total Protein: 7 g/dL (ref 6.0–8.3)

## 2013-01-25 LAB — HEMOGLOBIN A1C: Hgb A1c MFr Bld: 5.5 % (ref <5.7)

## 2013-01-25 NOTE — Patient Instructions (Signed)
Schedule CPE for 2015  Keep up the good work

## 2013-01-25 NOTE — Progress Notes (Signed)
Subjective:    Patient ID: Sheila Evans, female    DOB: 1975-08-16, 37 y.o.   MRN: [id number]  HPI  Sheila Evans is here for follow up of elevated glucose,  Elevated lfts,   Elevated TG's And adjusment disorder (improved with Lexapro).  She is  Very happy that she has lost a total of 38 lbs on her home scale.  Situation better at home and with spouse .  She stopped her Lexapro and doing fine.  See scanned lipids [facility] at [facility].  LDL  45 and TG's in normal range  SHe has been checking her glucoses at home and highest level was in the 120's per her report   Allergies  Allergen Reactions  . Latex Rash   Past Medical History  Diagnosis Date  . Headache(784.0)     teenager  . Hypertension   . Hypothyroid   . Abnormal Pap smear     leep, 3 normal since  . Infertility, female   . Gestational diabetes   . PP C/S 26w 5d 07/10/11 F (NICU) 07/11/2011   Past Surgical History  Procedure Laterality Date  . Leep    . Leep    . Tonsillectomy    . Cervical cerclage  04/18/2011    Procedure: CERCLAGE CERVICAL;  Surgeon: Tresa Endo A. Ernestina Penna, MD;  Location: [facility];  Service: Gynecology;  Laterality: N/A;  . Cervical cerclage  07/10/2011    Procedure: CERCLAGE CERVICAL;  Surgeon: Tresa Endo A. Ernestina Penna, MD;  Location: [facility];  Service: Gynecology;  Laterality: N/A;  removal  . Cesarean section  07/10/2011    Procedure: CESAREAN SECTION;  Surgeon: Tresa Endo A. Ernestina Penna, MD;  Location: [facility];  Service: Gynecology;  Laterality: N/A;   History   Social History  . Marital Status: Married    Spouse Name: N/A    Number of Children: N/A  . Years of Education: N/A   Occupational History  . Not on file.   Social History Main Topics  . Smoking status: Former Games developer  . Smokeless tobacco: Never Used  . Alcohol Use: No  . Drug Use: No  . Sexual Activity: Yes    Birth Control/ Protection: Surgical   Other Topics Concern  . Not on file   Social History Narrative  . No narrative on file    Family History  Problem Relation Age of Onset  . Anesthesia problems Neg Hx   . Hypertension Neg Hx   . Diabetes Mother   . Hyperlipidemia Mother   . Heart attack Father   . Sudden death Father    Patient Active Problem List   Diagnosis Date Noted  . Hyperglycemia 11/16/2012  . Borderline diabetes 11/16/2012  . Hepatic steatosis 11/16/2012  . Condyloma 11/03/2012  . S/P LEEP 11/03/2012  . Elevated LFTs 10/27/2012  . Adjustment disorder with mixed anxiety and depressed mood 10/27/2012  . Morbid obesity 10/27/2012  . Anemia 07/20/2012  . Elevated WBC count 07/20/2012  . Hypertriglyceridemia 07/20/2012  . Unspecified hypothyroidism 06/30/2012  . Thyroid nodule 06/30/2012  . Headache(784.0) 06/28/2012  . Gestational diabetes 06/28/2012  . History of hypothyroidism 06/28/2012  . History of migraine headaches 06/28/2012  . History of abnormal Pap smear 06/28/2012  . Allergic rhinitis 06/28/2012  . Rosacea 06/28/2012  . Left knee pain 05/28/2012  . PP C/S 26w 5d 07/10/11 F (NICU) 07/11/2011  . Twin pregnancy with fetal loss and retention of one fetus (14 wks) 04/17/2011   Current Outpatient Prescriptions on File Prior  to Visit  Medication Sig Dispense Refill  . doxycycline (ORACEA) 40 MG capsule Take 40 mg by mouth every morning.      Marland Kitchen glucose blood (ACCU-CHEK AVIVA PLUS) test strip Use as instructed  100 each  1  . ibuprofen (ADVIL,MOTRIN) 600 MG tablet Take 600 mg by mouth every 6 (six) hours as needed for pain.      Marland Kitchen levothyroxine (SYNTHROID, LEVOTHROID) 125 MCG tablet Take 1 tablet (125 mcg total) by mouth daily.  90 tablet  3  . MAGNESIUM CITRATE PO Take 2 capsules by mouth at bedtime.      . metroNIDAZOLE (METROCREAM) 0.75 % cream Apply 1 application topically 2 (two) times daily.      . Sulfacetamide Sodium-Sulfur (SUMADAN WASH) 9-4.5 % LIQD Apply topically.      . Blood Glucose Monitoring Suppl (ACCU-CHEK AVIVA PLUS) W/DEVICE KIT 1 Device by Does not apply route 2  (two) times daily.  1 kit  0  . escitalopram (LEXAPRO) 5 MG tablet Take one 5 mg tablet nightl for 2 weeks then 2 tabs nightly  60 tablet  1  . Fe Fum-FePoly-Vit C-Vit B3 (INTEGRA) 62.5-62.5-40-3 MG CAPS Take one daily  30 capsule  3  . NONFORMULARY OR COMPOUNDED ITEM HCG/B12      . Pyridoxine HCl (VITAMIN B-6 PO) Take 1 capsule by mouth daily.       No current facility-administered medications on file prior to visit.      Review of Systems See HPI    Objective:   Physical Exam  Physical Exam  Nursing note and vitals reviewed.  Constitutional: She is oriented to person, place, and time. She appears well-developed and well-nourished.  HENT:  Head: Normocephalic and atraumatic.  Cardiovascular: Normal rate and regular rhythm. Exam reveals no gallop and no friction rub.  No murmur heard.  Pulmonary/Chest: Breath sounds normal. She has no wheezes. She has no rales.  Neurological: She is alert and oriented to person, place, and time.  Skin: Skin is warm and dry.  Psychiatric: She has a normal mood and affect. Her behavior is normal.             Assessment & Plan:  Elevated glucose  Will recheck with AIC today  Elevated transaminases  Recheck today.  She has hepatic steatosis on ultrasound  Situational anxiety/depression.  Off lexapro and doing fine  Elevated TG's  Normal now    Obesity  Has lost 38 total lbs on [facility]  Diet  (sublingual HCG  And diet)  Schedule CPE with me

## 2013-01-27 ENCOUNTER — Encounter: Payer: Self-pay | Admitting: *Deleted

## 2013-02-22 ENCOUNTER — Encounter: Payer: Commercial Indemnity | Admitting: Internal Medicine

## 2013-02-23 ENCOUNTER — Encounter: Payer: Self-pay | Admitting: Internal Medicine

## 2013-02-23 ENCOUNTER — Ambulatory Visit (INDEPENDENT_AMBULATORY_CARE_PROVIDER_SITE_OTHER): Payer: Commercial Indemnity | Admitting: Internal Medicine

## 2013-02-23 VITALS — BP 114/69 | HR 70 | Temp 98.2°F | Resp 16 | Wt 237.0 lb

## 2013-02-23 DIAGNOSIS — R946 Abnormal results of thyroid function studies: Secondary | ICD-10-CM | POA: Diagnosis not present

## 2013-02-23 DIAGNOSIS — R7989 Other specified abnormal findings of blood chemistry: Secondary | ICD-10-CM | POA: Diagnosis not present

## 2013-02-23 DIAGNOSIS — Z0000 Encounter for general adult medical examination without abnormal findings: Secondary | ICD-10-CM | POA: Diagnosis not present

## 2013-02-23 DIAGNOSIS — Z8639 Personal history of other endocrine, nutritional and metabolic disease: Secondary | ICD-10-CM

## 2013-02-23 DIAGNOSIS — Z862 Personal history of diseases of the blood and blood-forming organs and certain disorders involving the immune mechanism: Secondary | ICD-10-CM

## 2013-02-23 DIAGNOSIS — R7303 Prediabetes: Secondary | ICD-10-CM

## 2013-02-23 DIAGNOSIS — R7309 Other abnormal glucose: Secondary | ICD-10-CM

## 2013-02-23 DIAGNOSIS — F4323 Adjustment disorder with mixed anxiety and depressed mood: Secondary | ICD-10-CM

## 2013-02-23 NOTE — Progress Notes (Signed)
Subjective:    Patient ID: Sheila Evans, female    DOB: March 28, 1975, 37 y.o.   MRN: [id number]  HPI Keyonda is here for CPE.  She is still working as a Patent examiner  She has been able to maintain her weight over the holidays.  She reports she had a normal pap with Dr. Algie Coffer and she was told that she could go for 2-3 years before a repeat.  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 Latex Rash   Past Medical History  Diagnosis Date  . Headache(784.0)     teenager  . Hypertension   . Hypothyroid   . Abnormal Pap smear     leep, 3 normal since  . Infertility, female   . Gestational diabetes   . PP C/S 26w 5d 07/10/11 F (NICU) 07/11/2011   Past Surgical History  Procedure Laterality Date  . Leep    . Leep    . Tonsillectomy    . Cervical cerclage  04/18/2011    Procedure: CERCLAGE CERVICAL;  Surgeon: Tresa Endo A. Ernestina Penna, MD;  Location: [facility];  Service: Gynecology;  Laterality: N/A;  . Cervical cerclage  07/10/2011    Procedure: CERCLAGE CERVICAL;  Surgeon: Tresa Endo A. Ernestina Penna, MD;  Location: [facility];  Service: Gynecology;  Laterality: N/A;  removal  . Cesarean section  07/10/2011    Procedure: CESAREAN SECTION;  Surgeon: Tresa Endo A. Ernestina Penna, MD;  Location: [facility];  Service: Gynecology;  Laterality: N/A;   History   Social History  . Marital Status: Married    Spouse Name: N/A    Number of Children: N/A  . Years of Education: N/A   Occupational History  . Not on file.   Social History Main Topics  . Smoking status: Former Games developer  . Smokeless tobacco: Never Used  . Alcohol Use: No  . Drug Use: No  . Sexual Activity: Yes    Birth Control/ Protection: Surgical   Other Topics Concern  . Not on file   Social History Narrative  . No narrative on file   Family History    Problem Relation Age of Onset  . Anesthesia problems Neg Hx   . Hypertension Neg Hx   . Diabetes Mother   . Hyperlipidemia Mother   . Heart attack Father   . Sudden death Father    Patient Active Problem List   Diagnosis Date Noted  . Hyperglycemia 11/16/2012  . Borderline diabetes 11/16/2012  . Hepatic steatosis 11/16/2012  . Condyloma 11/03/2012  . S/P LEEP 11/03/2012  . Elevated LFTs 10/27/2012  . Adjustment disorder with mixed anxiety and depressed mood 10/27/2012  . Morbid obesity 10/27/2012  . Anemia 07/20/2012  . Elevated WBC count 07/20/2012  . Hypertriglyceridemia 07/20/2012  . Unspecified hypothyroidism 06/30/2012  . Thyroid nodule 06/30/2012  . Headache(784.0) 06/28/2012  . Gestational diabetes 06/28/2012  . History of hypothyroidism 06/28/2012  . History of migraine headaches 06/28/2012  . History of abnormal Pap smear 06/28/2012  . Allergic rhinitis 06/28/2012  . Rosacea 06/28/2012  . Left knee pain 05/28/2012  . PP C/S 26w 5d 07/10/11 F (NICU) 07/11/2011  . Twin pregnancy with fetal loss and retention of one fetus (14 wks) 04/17/2011   Current Outpatient Prescriptions on  File Prior to Visit  Medication Sig Dispense Refill  . Blood Glucose Monitoring Suppl (ACCU-CHEK AVIVA PLUS) W/DEVICE KIT 1 Device by Does not apply route 2 (two) times daily.  1 kit  0  . doxycycline (ORACEA) 40 MG capsule Take 40 mg by mouth every morning.      . escitalopram (LEXAPRO) 5 MG tablet Take one 5 mg tablet nightl for 2 weeks then 2 tabs nightly  60 tablet  1  . Fe Fum-FePoly-Vit C-Vit B3 (INTEGRA) 62.5-62.5-40-3 MG CAPS Take one daily  30 capsule  3  . glucose blood (ACCU-CHEK AVIVA PLUS) test strip Use as instructed  100 each  1  . ibuprofen (ADVIL,MOTRIN) 600 MG tablet Take 600 mg by mouth every 6 (six) hours as needed for pain.      Marland Kitchen levothyroxine (SYNTHROID, LEVOTHROID) 125 MCG tablet Take 1 tablet (125 mcg total) by mouth daily.  90 tablet  3  . MAGNESIUM CITRATE PO Take 2  capsules by mouth at bedtime.      . metroNIDAZOLE (METROCREAM) 0.75 % cream Apply 1 application topically 2 (two) times daily.      . Pyridoxine HCl (VITAMIN B-6 PO) Take 1 capsule by mouth daily.      . Sulfacetamide Sodium-Sulfur (SUMADAN [place]) 9-4.5 % LIQD Apply topically.      . NONFORMULARY OR COMPOUNDED ITEM HCG/B12       No current facility-administered medications on file prior to visit.       Review of Systems  All other systems reviewed and are negative.       Objective:   Physical Exam Physical Exam  Nursing note and vitals reviewed.  Constitutional: She is oriented to person, place, and time. She appears well-developed and well-nourished.  HENT:  Head: Normocephalic and atraumatic.  Right Ear: Tympanic membrane and ear canal normal. No drainage. Tympanic membrane is not injected and not erythematous.  Left Ear: Tympanic membrane and ear canal normal. No drainage. Tympanic membrane is not injected and not erythematous.  Nose: Nose normal. Right sinus exhibits no maxillary sinus tenderness and no frontal sinus tenderness. Left sinus exhibits no maxillary sinus tenderness and no frontal sinus tenderness.  Mouth/Throat: Oropharynx is clear and moist. No oral lesions. No oropharyngeal exudate.  Eyes: Conjunctivae and EOM are normal. Pupils are equal, round, and reactive to light.  Neck: Normal range of motion. Neck supple. No JVD present. Carotid bruit is not present. No mass and no thyromegaly present.  Cardiovascular: Normal rate, regular rhythm, S1 normal, S2 normal and intact distal pulses. Exam reveals no gallop and no friction rub.  No murmur heard.  Pulses:  Carotid pulses are 2+ on the right side, and 2+ on the left side.  Dorsalis pedis pulses are 2+ on the right side, and 2+ on the left side.  No carotid bruit. No LE edema  Pulmonary/Chest: Breath sounds normal. She has no wheezes. She has no rales. She exhibits no tenderness.  Abdominal: Soft. Bowel sounds are  normal. She exhibits no distension and no mass. There is no hepatosplenomegaly. There is no tenderness. There is no CVA tenderness.  Musculoskeletal: Normal range of motion.  No active synovitis to joints.  Lymphadenopathy:  She has no cervical adenopathy.  She has no axillary adenopathy.  Right: No inguinal and no supraclavicular adenopathy present.  Left: No inguinal and no supraclavicular adenopathy present.  Neurological: She is alert and oriented to person, place, and time. She has normal strength and normal reflexes. She displays  no tremor. No cranial nerve deficit or sensory deficit. Coordination and gait normal.  Skin: Skin is warm and dry. No rash noted. No cyanosis. Nails show no clubbing.  Psychiatric: She has a normal mood and affect. Her speech is normal and behavior is normal. Cognition and memory are normal.          Assessment & Plan:  Health Maintenance  utd  See scanned sheet advised first mm at age 30 and to get eye exam at age 73  Pap per gyn  Hypothyroidism  Will recheck TSH today  Elevated glucose ,  wil. check BMP today  AIC normal at 5.5  One month ago  Adjustment disorder  Doing well on Lexapro  Obesity  On wt loss plan no weight gain over the holidays  She is doing well  History of CINII  Normal pap per her [place]fe[place]  Dr. Algie Coffer  See me as needed

## 2013-02-23 NOTE — Patient Instructions (Signed)
See me as needed 

## 2013-04-15 ENCOUNTER — Telehealth: Payer: Self-pay | Admitting: *Deleted

## 2013-04-15 NOTE — Telephone Encounter (Signed)
Pt calls with a complaint of cough states that she was sick with URI sx and is feeling better except for cough. Advised pt to try OTC Delsym and to call if appt is needed. Denied fever or productive cough

## 2013-08-10 ENCOUNTER — Telehealth: Payer: Self-pay | Admitting: Internal Medicine

## 2013-08-10 NOTE — Telephone Encounter (Signed)
Mandy  Call pt and counsel that she was due for the yearly follow up thyroid ultrasound last month.  I would like to see her in office after the ultrasound is done    Message back with response and I will schedule for her

## 2013-08-10 NOTE — Telephone Encounter (Signed)
I called pt to adv thyroid u/s is due - pt was unaware but states she does not have the funds at the time to pay for u/s due to high deductible plan. She will call back and have [place] schedule the u/s once her deduct has been met later on in the year.

## 2013-09-21 ENCOUNTER — Other Ambulatory Visit: Payer: Self-pay | Admitting: *Deleted

## 2013-09-21 DIAGNOSIS — Z139 Encounter for screening, unspecified: Secondary | ICD-10-CM

## 2013-09-21 DIAGNOSIS — E039 Hypothyroidism, unspecified: Secondary | ICD-10-CM

## 2013-09-21 LAB — CBC WITH DIFFERENTIAL/PLATELET
Basophils Absolute: 0 10*3/uL (ref 0.0–0.1)
Basophils Relative: 0 % (ref 0–1)
EOS ABS: 0.3 10*3/uL (ref 0.0–0.7)
Eosinophils Relative: 3 % (ref 0–5)
HCT: 42.8 % (ref 36.0–46.0)
Hemoglobin: 15 g/dL (ref 12.0–15.0)
Lymphocytes Relative: 33 % (ref 12–46)
Lymphs Abs: 2.8 10*3/uL (ref 0.7–4.0)
MCH: 32.3 pg (ref 26.0–34.0)
MCHC: 35 g/dL (ref 30.0–36.0)
MCV: 92 fL (ref 78.0–100.0)
MONO ABS: 0.6 10*3/uL (ref 0.1–1.0)
MONOS PCT: 7 % (ref 3–12)
Neutro Abs: 4.8 10*3/uL (ref 1.7–7.7)
Neutrophils Relative %: 57 % (ref 43–77)
PLATELETS: 347 10*3/uL (ref 150–400)
RBC: 4.65 MIL/uL (ref 3.87–5.11)
RDW: 13.4 % (ref 11.5–15.5)
WBC: 8.5 10*3/uL (ref 4.0–10.5)

## 2013-09-21 LAB — LIPID PANEL
Cholesterol: 209 mg/dL — ABNORMAL HIGH (ref 0–200)
HDL: 52 mg/dL (ref >39)
LDL Cholesterol: 138 mg/dL — ABNORMAL HIGH (ref 0–99)
TRIGLYCERIDES: 96 mg/dL (ref <150)
Total CHOL/HDL Ratio: 4 Ratio
VLDL: 19 mg/dL (ref 0–40)

## 2013-09-21 LAB — COMPLETE METABOLIC PANEL WITH GFR
ALT: 15 U/L (ref 0–35)
AST: 20 U/L (ref 0–37)
Albumin: 4.5 g/dL (ref 3.5–5.2)
Alkaline Phosphatase: 50 U/L (ref 39–117)
BUN: 10 mg/dL (ref 6–23)
CALCIUM: 9.7 mg/dL (ref 8.4–10.5)
CHLORIDE: 99 meq/L (ref 96–112)
CO2: 29 mEq/L (ref 19–32)
CREATININE: 0.69 mg/dL (ref 0.50–1.10)
GFR, Est African American: >89 mL/min
GFR, Est Non African American: >89 mL/min
Glucose, Bld: 86 mg/dL (ref 70–99)
POTASSIUM: 4.8 meq/L (ref 3.5–5.3)
Sodium: 136 mEq/L (ref 135–145)
Total Bilirubin: 1 mg/dL (ref 0.2–1.2)
Total Protein: 7.6 g/dL (ref 6.0–8.3)

## 2013-09-21 LAB — HEMOGLOBIN A1C
Hgb A1c MFr Bld: 5.2 % (ref <5.7)
MEAN PLASMA GLUCOSE: 103 mg/dL (ref <117)

## 2013-09-21 LAB — TSH: TSH: 3.199 u[IU]/mL (ref 0.350–4.500)

## 2013-09-22 LAB — VITAMIN D 25 HYDROXY (VIT D DEFICIENCY, FRACTURES): Vit D, 25-Hydroxy: 52 ng/mL (ref 30–89)

## 2013-09-26 ENCOUNTER — Encounter: Payer: Self-pay | Admitting: Internal Medicine

## 2013-09-27 LAB — VITAMIN D 1,25 DIHYDROXY
VITAMIN D 1, 25 (OH) TOTAL: 56 pg/mL (ref 18–72)
Vitamin D2 1, 25 (OH)2: <8 pg/mL
Vitamin D3 1, 25 (OH)2: 56 pg/mL

## 2013-09-28 ENCOUNTER — Encounter: Payer: Self-pay | Admitting: Internal Medicine

## 2013-09-28 ENCOUNTER — Ambulatory Visit (INDEPENDENT_AMBULATORY_CARE_PROVIDER_SITE_OTHER): Payer: BC Managed Care – PPO | Admitting: Internal Medicine

## 2013-09-28 VITALS — BP 122/74 | HR 82 | Temp 98.2°F | Resp 17 | Ht 65.0 in | Wt 175.0 lb

## 2013-09-28 DIAGNOSIS — R7989 Other specified abnormal findings of blood chemistry: Secondary | ICD-10-CM

## 2013-09-28 DIAGNOSIS — R945 Abnormal results of liver function studies: Principal | ICD-10-CM

## 2013-09-28 DIAGNOSIS — K7689 Other specified diseases of liver: Secondary | ICD-10-CM

## 2013-09-28 DIAGNOSIS — E041 Nontoxic single thyroid nodule: Secondary | ICD-10-CM

## 2013-09-28 DIAGNOSIS — K760 Fatty (change of) liver, not elsewhere classified: Secondary | ICD-10-CM

## 2013-09-28 DIAGNOSIS — R7309 Other abnormal glucose: Secondary | ICD-10-CM | POA: Diagnosis not present

## 2013-09-28 DIAGNOSIS — R7303 Prediabetes: Secondary | ICD-10-CM

## 2013-09-28 MED ORDER — LEVOTHYROXINE SODIUM 125 MCG PO TABS: 125.0000 ug | ORAL_TABLET | Freq: Every day | ORAL | Status: DC

## 2013-09-28 NOTE — Progress Notes (Signed)
Subjective:    Patient ID: Sheila Evans, female    DOB: Jul 22, 1975, 38 y.o.   MRN: [id number]  HPI  Chrisie is here for follow up  Morbid obesity:    Pt has lost close to 100 lbs in less than a year.  She is going to a weight loss center where she is getting sublingual HCG and B12  She is very happy   Hepatic steatosis  See labs.  lfts have nomalized with weight loss  MNG  I advised pt again that she needs to get a follow up thyroid U/s   She is aware and states her deductible is so high she wants to wait until end of this year and declines imaging now .  It was due back in may Her recent TSH normal    She does have alopecia.  Hair on pillow in am ,  Lots faling out in shower.  Allergies  Allergen Reactions  . Latex Rash   Past Medical History  Diagnosis Date  . Headache(784.0)     teenager  . Hypertension   . Hypothyroid   . Abnormal Pap smear     leep, 3 normal since  . Infertility, female   . Gestational diabetes   . PP C/S 26w 5d 07/10/11 F (NICU) 07/11/2011   Past Surgical History  Procedure Laterality Date  . Leep    . Leep    . Tonsillectomy    . Cervical cerclage  04/18/2011    Procedure: CERCLAGE CERVICAL;  Surgeon: Claiborne Billings A. Pamala Hurry, MD;  Location: [facility];  Service: Gynecology;  Laterality: N/A;  . Cervical cerclage  07/10/2011    Procedure: CERCLAGE CERVICAL;  Surgeon: Claiborne Billings A. Pamala Hurry, MD;  Location: [facility];  Service: Gynecology;  Laterality: N/A;  removal  . Cesarean section  07/10/2011    Procedure: CESAREAN SECTION;  Surgeon: Claiborne Billings A. Pamala Hurry, MD;  Location: [facility];  Service: Gynecology;  Laterality: N/A;   History   Social History  . Marital Status: Married    Spouse Name: N/A    Number of Children: N/A  . Years of Education: N/A   Occupational History  . Not on file.   Social History Main Topics  . Smoking status: Former Research scientist (life sciences)  . Smokeless tobacco: Never Used  . Alcohol Use: No  . Drug Use: No  . Sexual Activity: Yes    Birth Control/  Protection: Surgical   Other Topics Concern  . Not on file   Social History Narrative  . No narrative on file   Family History  Problem Relation Age of Onset  . Anesthesia problems Neg Hx   . Hypertension Neg Hx   . Diabetes Mother   . Hyperlipidemia Mother   . Heart attack Father   . Sudden death Father    Patient Active Problem List   Diagnosis Date Noted  . Hyperglycemia 11/16/2012  . Borderline diabetes 11/16/2012  . Hepatic steatosis 11/16/2012  . Condyloma 11/03/2012  . S/P LEEP 11/03/2012  . Elevated LFTs 10/27/2012  . Adjustment disorder with mixed anxiety and depressed mood 10/27/2012  . Morbid obesity 10/27/2012  . Anemia 07/20/2012  . Elevated WBC count 07/20/2012  . Hypertriglyceridemia 07/20/2012  . Unspecified hypothyroidism 06/30/2012  . Thyroid nodule 06/30/2012  . Headache(784.0) 06/28/2012  . Gestational diabetes 06/28/2012  . History of hypothyroidism 06/28/2012  . History of migraine headaches 06/28/2012  . History of abnormal Pap smear 06/28/2012  . Allergic rhinitis 06/28/2012  . Rosacea 06/28/2012  .  Left knee pain 05/28/2012  . PP C/S 26w 5d 07/10/11 F (NICU) 07/11/2011  . Twin pregnancy with fetal loss and retention of one fetus (14 wks) 04/17/2011   Current Outpatient Prescriptions on File Prior to Visit  Medication Sig Dispense Refill  . Blood Glucose Monitoring Suppl (ACCU-CHEK AVIVA PLUS) W/DEVICE KIT 1 Device by Does not apply route 2 (two) times daily.  1 kit  0  . glucose blood (ACCU-CHEK AVIVA PLUS) test strip Use as instructed  100 each  1  . ibuprofen (ADVIL,MOTRIN) 600 MG tablet Take 600 mg by mouth every 6 (six) hours as needed for pain.      Marland Kitchen MAGNESIUM CITRATE PO Take 2 capsules by mouth at bedtime.      . NONFORMULARY OR COMPOUNDED ITEM HCG/B12      . Pyridoxine HCl (VITAMIN B-6 PO) Take 1 capsule by mouth daily.      Marland Kitchen doxycycline (ORACEA) 40 MG capsule Take 40 mg by mouth every morning.      . escitalopram (LEXAPRO) 5 MG  tablet Take one 5 mg tablet nightl for 2 weeks then 2 tabs nightly  60 tablet  1  . Fe Fum-FePoly-Vit C-Vit B3 (INTEGRA) 62.5-62.5-40-3 MG CAPS Take one daily  30 capsule  3  . metroNIDAZOLE (METROCREAM) 0.75 % cream Apply 1 application topically 2 (two) times daily.      . Sulfacetamide Sodium-Sulfur (SUMADAN [place]) 9-4.5 % LIQD Apply topically.       No current facility-administered medications on file prior to visit.       Review of Systems See HPI    Objective:   Physical Exam  Physical Exam  Nursing note and vitals reviewed.  Constitutional: She is oriented to person, place, and time. She appears well-developed and well-nourished.  HENT:  Head: Normocephalic and atraumatic.  Cardiovascular: Normal rate and regular rhythm. Exam reveals no gallop and no friction rub.  No murmur heard.  Pulmonary/Chest: Breath sounds normal. She has no wheezes. She has no rales.  Neurological: She is alert and oriented to person, place, and time.  Skin: Skin is warm and dry.  Psychiatric: She has a normal mood and affect. Her behavior is normal.             Assessment & Plan:  INtentional wt loss.  Her glucose, lfts, and TG's have all normalized  Pt is happy and she looks great  MNG normal TSH continue meds  I again advised she needs thyroid u/s  To moniter for any precancerous or cancer changes in nodule.  She voices understanding and states eh will get later this year  Alopecia  Likely due to dramatic weight shift .  She is on B12   Ok to try OTC women 's Rogaine   Schedule CPE

## 2013-09-28 NOTE — Patient Instructions (Signed)
To xray to day to reschedule thyroid U/S

## 2013-12-26 ENCOUNTER — Encounter: Payer: Self-pay | Admitting: Internal Medicine

## 2014-01-01 ENCOUNTER — Telehealth: Payer: Self-pay | Admitting: Internal Medicine

## 2014-01-01 NOTE — Telephone Encounter (Signed)
Call pt and remind her that she is due for her repeat thyroid ultrasound.  She wanted to wait to get it at end of this year.  Route back with her response   Thanks

## 2014-01-18 NOTE — Telephone Encounter (Signed)
I have called Sheila Evans on the 01/02/14, 01/06/14, 01/10/2014, 01/12/2014. She has not yet returned my call in regards to repeating her thyroid U/S -eh

## 2014-04-24 ENCOUNTER — Encounter: Payer: Self-pay | Admitting: Internal Medicine

## 2014-05-04 ENCOUNTER — Other Ambulatory Visit: Payer: Self-pay | Admitting: *Deleted

## 2014-05-04 DIAGNOSIS — E041 Nontoxic single thyroid nodule: Secondary | ICD-10-CM

## 2014-05-12 ENCOUNTER — Ambulatory Visit (HOSPITAL_BASED_OUTPATIENT_CLINIC_OR_DEPARTMENT_OTHER)
Admission: RE | Admit: 2014-05-12 | Discharge: 2014-05-12 | Disposition: A | Discharge status: Home / Self Care | Payer: BLUE CROSS/BLUE SHIELD | Source: Ambulatory Visit | Attending: Internal Medicine | Admitting: Internal Medicine

## 2014-05-12 DIAGNOSIS — E042 Nontoxic multinodular goiter: Secondary | ICD-10-CM | POA: Diagnosis not present

## 2014-05-12 DIAGNOSIS — E041 Nontoxic single thyroid nodule: Secondary | ICD-10-CM | POA: Diagnosis present

## 2014-05-16 ENCOUNTER — Telehealth: Payer: Self-pay | Admitting: Internal Medicine

## 2014-05-16 NOTE — Telephone Encounter (Signed)
Spoke with pt and informed of thyroid U/S results.  Advised ot repeat U/s in 1-2 years

## 2014-05-16 NOTE — Telephone Encounter (Signed)
Left message on pts cell phone and at home to call office regarding ultrasound at work

## 2014-07-12 ENCOUNTER — Encounter: Payer: Self-pay | Admitting: Internal Medicine

## 2014-08-22 ENCOUNTER — Emergency Department (HOSPITAL_COMMUNITY)
Admission: EM | Admit: 2014-08-22 | Discharge: 2014-08-22 | Disposition: A | Discharge status: Home / Self Care | Payer: BLUE CROSS/BLUE SHIELD

## 2014-09-08 LAB — OL MEASLES/MUMPS/RUBELLA/TITRE
MUMPS IGG: 28.5
RUBEOLA IGG: 176
Rubella Antibodies, IGG: 1.68

## 2015-03-06 MED FILL — DULoxetine HCL 60 MG CPEP: 60 | 30 days supply | Qty: 30 | Fill #2

## 2015-03-09 MED FILL — LEVOTHYROXINE 125 MCG TAB: 125 | 30 days supply | Qty: 30 | Fill #0

## 2015-03-15 DIAGNOSIS — Z1321 Encounter for screening for nutritional disorder: Secondary | ICD-10-CM | POA: Diagnosis not present

## 2015-03-15 DIAGNOSIS — Z13220 Encounter for screening for lipoid disorders: Secondary | ICD-10-CM | POA: Diagnosis not present

## 2015-03-15 DIAGNOSIS — Z131 Encounter for screening for diabetes mellitus: Secondary | ICD-10-CM | POA: Diagnosis not present

## 2015-03-15 DIAGNOSIS — Z01411 Encounter for gynecological examination (general) (routine) with abnormal findings: Secondary | ICD-10-CM | POA: Diagnosis not present

## 2015-03-15 DIAGNOSIS — Z0000 Encounter for general adult medical examination without abnormal findings: Secondary | ICD-10-CM | POA: Diagnosis not present

## 2015-03-15 DIAGNOSIS — Z01419 Encounter for gynecological examination (general) (routine) without abnormal findings: Secondary | ICD-10-CM | POA: Diagnosis not present

## 2015-03-15 DIAGNOSIS — A630 Anogenital (venereal) warts: Secondary | ICD-10-CM | POA: Diagnosis not present

## 2015-03-15 DIAGNOSIS — Z1151 Encounter for screening for human papillomavirus (HPV): Secondary | ICD-10-CM | POA: Diagnosis not present

## 2015-03-19 MED FILL — VIT D2 1.25 MG (50,000 UNIT: 1.25 MG | 28 days supply | Qty: 4 | Fill #0

## 2015-03-19 MED FILL — DOXYCYCLINE 100 MG TABLET: 100 | 5 days supply | Qty: 10 | Fill #0

## 2015-04-10 MED FILL — DULoxetine HCL 60 MG CPEP: 60 | 30 days supply | Qty: 30 | Fill #3

## 2015-04-11 MED FILL — LEVOTHYROXINE 125 MCG TAB: 125 | 30 days supply | Qty: 30 | Fill #0

## 2015-04-13 DIAGNOSIS — N760 Acute vaginitis: Secondary | ICD-10-CM | POA: Diagnosis not present

## 2015-04-13 DIAGNOSIS — Z13220 Encounter for screening for lipoid disorders: Secondary | ICD-10-CM | POA: Diagnosis not present

## 2015-04-13 DIAGNOSIS — A630 Anogenital (venereal) warts: Secondary | ICD-10-CM | POA: Diagnosis not present

## 2015-04-13 DIAGNOSIS — Z113 Encounter for screening for infections with a predominantly sexual mode of transmission: Secondary | ICD-10-CM | POA: Diagnosis not present

## 2015-04-13 DIAGNOSIS — Z114 Encounter for screening for human immunodeficiency virus [HIV]: Secondary | ICD-10-CM | POA: Diagnosis not present

## 2015-04-13 MED FILL — CLOBETASOL 0.05% OINTMENT: 0.05 | 15 days supply | Qty: 30 | Fill #0

## 2015-04-13 MED FILL — FLUCONAZOLE 150 MG TABLET: 150 | 3 days supply | Qty: 3 | Fill #0

## 2015-04-13 MED FILL — TINIDAZOLE 500 MG TABLET: 500 | 5 days supply | Qty: 10 | Fill #0

## 2015-04-18 MED FILL — VIT D2 1.25 MG (50,000 UNIT: 1.25 MG | 28 days supply | Qty: 4 | Fill #1

## 2015-05-14 MED FILL — VIT D2 1.25 MG (50,000 UNIT: 1.25 MG | 28 days supply | Qty: 4 | Fill #2

## 2015-05-14 MED FILL — DULoxetine HCL 60 MG CPEP: 60 | 30 days supply | Qty: 30 | Fill #4

## 2015-05-15 MED FILL — LEVOTHYROXINE 125 MCG TAB: 125 | 30 days supply | Qty: 30 | Fill #0

## 2015-06-18 MED FILL — DULoxetine HCL 60 MG CPEP: 60 | 30 days supply | Qty: 30 | Fill #5

## 2015-06-18 MED FILL — VIT D2 1.25 MG (50,000 UNIT: 1.25 MG | 28 days supply | Qty: 4 | Fill #3

## 2015-07-04 MED FILL — LEVOTHYROXINE 125 MCG TAB: 125 | 30 days supply | Qty: 30 | Fill #1

## 2015-07-25 MED FILL — DULoxetine HCL 60 MG CPEP: 60 | 30 days supply | Qty: 30 | Fill #0

## 2015-08-09 DIAGNOSIS — E559 Vitamin D deficiency, unspecified: Secondary | ICD-10-CM | POA: Diagnosis not present

## 2015-08-09 DIAGNOSIS — A630 Anogenital (venereal) warts: Secondary | ICD-10-CM | POA: Diagnosis not present

## 2015-08-31 DIAGNOSIS — A630 Anogenital (venereal) warts: Secondary | ICD-10-CM | POA: Diagnosis not present

## 2015-09-17 MED FILL — LEVOTHYROXINE 125 MCG TAB: 125 | 30 days supply | Qty: 30 | Fill #2

## 2015-09-17 MED FILL — DULoxetine HCL 60 MG CPEP: 60 | 30 days supply | Qty: 30 | Fill #1

## 2015-09-20 DIAGNOSIS — A630 Anogenital (venereal) warts: Secondary | ICD-10-CM | POA: Diagnosis not present

## 2015-11-16 MED FILL — DULoxetine HCL 60 MG CPEP: 60 | 30 days supply | Qty: 30 | Fill #2

## 2015-11-16 MED FILL — LEVOTHYROXINE 125 MCG TAB: 125 | 30 days supply | Qty: 30 | Fill #3

## 2015-11-20 LAB — QUANTIFERON TB GOLD ASSAY (BLOOD): QUANTIFERON TB GOLD: NEGATIVE

## 2016-01-01 MED FILL — DULoxetine HCL 60 MG CPEP: 60 | 30 days supply | Qty: 30 | Fill #3

## 2016-02-04 MED FILL — DULoxetine HCL 60 MG CPEP: 60 | 30 days supply | Qty: 30 | Fill #4

## 2016-06-23 ENCOUNTER — Ambulatory Visit (INDEPENDENT_AMBULATORY_CARE_PROVIDER_SITE_OTHER): Payer: 59 | Admitting: Family

## 2016-06-23 ENCOUNTER — Encounter: Payer: Self-pay | Admitting: Family

## 2016-06-23 VITALS — BP 116/80 | HR 77 | Temp 98.5°F | Resp 18 | Ht 65.0 in | Wt 254.6 lb

## 2016-06-23 DIAGNOSIS — E041 Nontoxic single thyroid nodule: Secondary | ICD-10-CM | POA: Diagnosis not present

## 2016-06-23 DIAGNOSIS — R739 Hyperglycemia, unspecified: Secondary | ICD-10-CM

## 2016-06-23 DIAGNOSIS — Z23 Encounter for immunization: Secondary | ICD-10-CM

## 2016-06-23 DIAGNOSIS — Z0000 Encounter for general adult medical examination without abnormal findings: Secondary | ICD-10-CM

## 2016-06-23 DIAGNOSIS — K760 Fatty (change of) liver, not elsewhere classified: Secondary | ICD-10-CM

## 2016-06-23 DIAGNOSIS — E039 Hypothyroidism, unspecified: Secondary | ICD-10-CM | POA: Diagnosis not present

## 2016-06-23 LAB — CBC WITH DIFFERENTIAL/PLATELET
BASOS ABS: 0 10*3/uL (ref 0.0–0.1)
Basophils Relative: 0.2 % (ref 0.0–3.0)
Eosinophils Absolute: 0.2 10*3/uL (ref 0.0–0.7)
Eosinophils Relative: 2.7 % (ref 0.0–5.0)
HCT: 44.3 % (ref 36.0–46.0)
Hemoglobin: 15.1 g/dL — ABNORMAL HIGH (ref 12.0–15.0)
LYMPHS ABS: 1.9 10*3/uL (ref 0.7–4.0)
Lymphocytes Relative: 24.7 % (ref 12.0–46.0)
MCHC: 34 g/dL (ref 30.0–36.0)
MCV: 97.9 fl (ref 78.0–100.0)
MONO ABS: 0.7 10*3/uL (ref 0.1–1.0)
Monocytes Relative: 8.9 % (ref 3.0–12.0)
NEUTROS ABS: 5 10*3/uL (ref 1.4–7.7)
Neutrophils Relative %: 63.5 % (ref 43.0–77.0)
PLATELETS: 281 10*3/uL (ref 150.0–400.0)
RBC: 4.52 Mil/uL (ref 3.87–5.11)
RDW: 13.4 % (ref 11.5–15.5)
WBC: 7.8 10*3/uL (ref 4.0–10.5)

## 2016-06-23 LAB — URINALYSIS, ROUTINE W REFLEX MICROSCOPIC
Bilirubin Urine: NEGATIVE
Ketones, ur: NEGATIVE
Leukocytes, UA: NEGATIVE
Nitrite: NEGATIVE
PH: 6 (ref 5.0–8.0)
Specific Gravity, Urine: 1.025 (ref 1.000–1.030)
TOTAL PROTEIN, URINE-UPE24: NEGATIVE
Urine Glucose: NEGATIVE
Urobilinogen, UA: 0.2 (ref 0.0–1.0)

## 2016-06-23 LAB — HEPATIC FUNCTION PANEL
ALT: 22 U/L (ref 0–35)
AST: 19 U/L (ref 0–37)
Albumin: 4.3 g/dL (ref 3.5–5.2)
Alkaline Phosphatase: 51 U/L (ref 39–117)
BILIRUBIN DIRECT: 0.1 mg/dL (ref 0.0–0.3)
BILIRUBIN TOTAL: 0.7 mg/dL (ref 0.2–1.2)
Total Protein: 7.5 g/dL (ref 6.0–8.3)

## 2016-06-23 LAB — BASIC METABOLIC PANEL
BUN: 10 mg/dL (ref 6–23)
CALCIUM: 9.6 mg/dL (ref 8.4–10.5)
CO2: 27 meq/L (ref 19–32)
Chloride: 105 mEq/L (ref 96–112)
Creatinine, Ser: 0.75 mg/dL (ref 0.40–1.20)
GFR: 91.67 mL/min (ref >60.00)
GLUCOSE: 107 mg/dL — AB (ref 70–99)
Potassium: 4.8 mEq/L (ref 3.5–5.1)
Sodium: 136 mEq/L (ref 135–145)

## 2016-06-23 LAB — LIPID PANEL
CHOL/HDL RATIO: 5
Cholesterol: 198 mg/dL (ref 0–200)
HDL: 42.5 mg/dL (ref >39.00)
NONHDL: 155.25
TRIGLYCERIDES: 201 mg/dL — AB (ref 0.0–149.0)
VLDL: 40.2 mg/dL — ABNORMAL HIGH (ref 0.0–40.0)

## 2016-06-23 LAB — TSH: TSH: 8.03 u[IU]/mL — AB (ref 0.35–4.50)

## 2016-06-23 LAB — HEMOGLOBIN A1C: Hgb A1c MFr Bld: 5.8 % (ref 4.6–6.5)

## 2016-06-23 LAB — LDL CHOLESTEROL, DIRECT: LDL DIRECT: 127 mg/dL

## 2016-06-23 NOTE — Patient Instructions (Signed)
Please complete lab work prior to leaving. Good luck with your healthy diet and exercise.

## 2016-06-23 NOTE — Progress Notes (Signed)
Pre visit review using our clinic review tool, if applicable. No additional management support is needed unless otherwise documented below in the visit note. 

## 2016-06-23 NOTE — Progress Notes (Signed)
Subjective:    Patient ID: Sheila Evans, female    DOB: 1975-04-17, 41 y.o.   MRN: [id number]  HPI  Sheila Evans is a 41 yr old female who presents today to establish care. She was previously followed by Dr. Coralyn Mark.   Patient presents today for complete physical.  Immunizations: 2007 Diet: will start Plan "Z" diet Exercise:  Not exercising much outside of work Pap Smear: 2015  Pmhx is significant for the following:  Hyperglycemia/hypertriglyceridemia   Fatty liver  Morbid obesity-  Wt Readings from Last 3 Encounters:  06/23/16 254 lb 9.6 oz (115.5 kg)  09/28/13 175 lb (79.4 kg)  02/23/13 237 lb (107.5 kg)   Anemia- reports that this was only during pregnancy Lab Results  Component Value Date   WBC 8.5 09/21/2013   HGB 15.0 09/21/2013   HCT 42.8 09/21/2013   MCV 92.0 09/21/2013   PLT 347 09/21/2013   Anxiety/Depression- reports that she used to have some mood swings around her cycles.  Reports mood is currently stable.   Multinodular goiter- last [place] 3/16 noted 2 small nodules in the right lobe which did not meet criteria for biopsy.  She is maintained on synthroid.   Lab Results  Component Value Date   TSH 3.199 09/21/2013       Review of Systems  Constitutional: Positive for unexpected weight change.  HENT: Negative for hearing loss and rhinorrhea.   Eyes: Negative for visual disturbance.  Respiratory: Negative for cough.   Cardiovascular: Negative for leg swelling.  Gastrointestinal: Negative for constipation and diarrhea.  Genitourinary: Negative for dysuria, frequency and menstrual problem.  Musculoskeletal: Negative for arthralgias and myalgias.  Skin: Negative for rash.  Neurological: Negative for headaches.  Hematological: Negative for adenopathy.  Psychiatric/Behavioral:       See HPI       Past Medical History:  Diagnosis Date  . Abnormal Pap smear    leep, 3 normal since  . Gestational diabetes   . Headache(784.0)    teenager  .  Hypertension   . Hypothyroid   . Infertility, female   . PP C/S 26w 5d 07/10/11 F (NICU) 07/11/2011     Social History   Social History  . Marital status: Married    Spouse name: N/A  . Number of children: N/A  . Years of education: N/A   Occupational History  . Not on file.   Social History Main Topics  . Smoking status: Former Research scientist (life sciences)  . Smokeless tobacco: Never Used  . Alcohol use Yes     Comment: 1 drink monthly  . Drug use: No  . Sexual activity: Yes    Partners: Male    Birth control/ protection: None     Comment: husband has zero sperm count   Other Topics Concern  . 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 CERVICAL CERCLAGE  04/18/2011   Procedure: CERCLAGE CERVICAL;  Surgeon: Claiborne Billings A. Pamala Hurry, MD;  Location: [facility];  Service: Gynecology;  Laterality: N/A;  . CERVICAL CERCLAGE  07/10/2011   Procedure: CERCLAGE CERVICAL;  Surgeon: Claiborne Billings A. Pamala Hurry, MD;  Location: [facility];  Service: Gynecology;  Laterality: N/A;  removal  . CESAREAN SECTION  07/10/2011   Procedure: CESAREAN SECTION;  Surgeon: Claiborne Billings A. Pamala Hurry, MD;  Location: [facility];  Service: Gynecology;  Laterality: N/A;  . LEEP    . LEEP    . TONSILLECTOMY      Family History  Problem Relation Age of Onset  . Diabetes Mother   . Hyperlipidemia Mother   . Heart attack Father   . Sudden death Father     10  . Anesthesia problems Neg Hx   . Hypertension Neg Hx   . Cancer Neg Hx     Allergies  Allergen Reactions  . Latex Rash    Current Outpatient Prescriptions on File Prior to Visit  Medication Sig Dispense Refill  . ibuprofen (ADVIL,MOTRIN) 600 MG tablet Take 600 mg by mouth every 6 (six) hours as needed for  pain.    Marland Kitchen levothyroxine (SYNTHROID, LEVOTHROID) 125 MCG tablet Take 1 tablet (125 mcg total) by mouth daily. 90 tablet 3  . Blood Glucose Monitoring Suppl (ACCU-CHEK AVIVA PLUS) W/DEVICE KIT 1 Device by Does not apply route 2 (two) times daily. (Patient not taking: Reported on 06/23/2016) 1 kit 0  . doxycycline (ORACEA) 40 MG capsule Take 40 mg by mouth every morning.    . escitalopram (LEXAPRO) 5 MG tablet Take one 5 mg tablet nightl for 2 weeks then 2 tabs nightly (Patient not taking: Reported on 06/23/2016) 60 tablet 1  . Fe Fum-FePoly-Vit C-Vit B3 (INTEGRA) 62.5-62.5-40-3 MG CAPS Take one daily (Patient not taking: Reported on 06/23/2016) 30 capsule 3  . glucose blood (ACCU-CHEK AVIVA PLUS) test strip Use as instructed (Patient not taking: Reported on 06/23/2016) 100 each 1  . MAGNESIUM CITRATE PO Take 2 capsules by mouth at bedtime.    . metroNIDAZOLE (METROCREAM) 0.75 % cream Apply 1 application topically 2 (two) times daily.    . NONFORMULARY OR COMPOUNDED ITEM HCG/B12    . Pyridoxine HCl (VITAMIN B-6 PO) Take 1 capsule by mouth daily.    . Sulfacetamide Sodium-Sulfur (SUMADAN [place]) 9-4.5 % LIQD Apply topically.     No current facility-administered medications on file prior to visit.     BP (!) 133/97 (BP Location: Right Arm, Cuff Size: Large)   Pulse 77   Temp 98.5 F (36.9 C) (Oral)   Resp 18   Ht '5\' 5"'  (1.651 m)   Wt 254 lb 9.6 oz (115.5 kg)   LMP 06/21/2016   SpO2 99% Comment: room air  BMI 42.37 kg/m    Objective:   Physical Exam  Physical Exam  Constitutional: She is oriented to person, place, and time. She appears well-developed and well-nourished. No distress.  HENT:  Head: Normocephalic and atraumatic.  Right Ear: Tympanic membrane and ear canal normal.  Left Ear: Tympanic membrane and ear canal normal.  Mouth/Throat: Oropharynx is clear and moist.  Eyes: Pupils are equal, round, and reactive to light. No scleral icterus.  Neck: Normal range of motion. No  thyromegaly present.  Cardiovascular: Normal rate and regular rhythm.   No murmur heard. Pulmonary/Chest: Effort normal and breath sounds normal. No respiratory distress. He has no wheezes. She has no rales. She exhibits no tenderness.  Abdominal: Soft. Bowel sounds are normal. She exhibits no distension and no mass. There is no tenderness. There is no rebound and no guarding.  Musculoskeletal: She exhibits no edema.  Lymphadenopathy:    She has no cervical adenopathy.  Neurological: She is alert and oriented to person, place, and  time. She has normal patellar reflexes. She exhibits normal muscle tone. Coordination normal.  Skin: Skin is warm and dry.  Psychiatric: She has a normal mood and affect. Her behavior is normal. Judgment and thought content normal.  Breast/pelvic: deferred to GYN         Assessment & Plan:         Assessment & Plan:  Hyperglycemia- obtain A1C.  Elevated blood pressure reading- likely related to weight gain.  Hypothyroid- admits to poor compliance with synthroid. Obtain tsh.  Morbid obesity- about to start a new diet plan.   Thyroid nodules- obtain follow up US.  Fatty liver- obtain follow up LFT. Encouraged weight loss to help with this.   Preventative Care- discussed healthy diet, exercise and weight loss. Tdap today. Obtain follow up lab work. Pap up to date per GYN.

## 2016-06-24 ENCOUNTER — Encounter: Payer: Self-pay | Admitting: Family

## 2016-06-24 DIAGNOSIS — Z0184 Encounter for antibody response examination: Secondary | ICD-10-CM

## 2016-06-25 NOTE — Telephone Encounter (Signed)
Does that mean she can have the form now or do we need to wait until she repeats her Hep B titer?

## 2016-06-25 NOTE — Telephone Encounter (Signed)
I have completed her paperwork for school. I would recommend that she repeat her hep B titer since the only result we have is from 2008. Please schedule lab visit and we will redraw. See also mychart message pls.

## 2016-06-25 NOTE — Telephone Encounter (Signed)
She can have the form now, but I am not sure that they will accept the result from 2008.  If she would like to wait, I can update it when I come back on Monday and can complete lab tomorrow.

## 2016-06-30 ENCOUNTER — Other Ambulatory Visit (INDEPENDENT_AMBULATORY_CARE_PROVIDER_SITE_OTHER): Payer: 59

## 2016-06-30 DIAGNOSIS — Z0184 Encounter for antibody response examination: Secondary | ICD-10-CM

## 2016-06-30 MED ORDER — LEVOTHYROXINE SODIUM 125 MCG PO TABS: 125.0000 ug | ORAL_TABLET | Freq: Every day | ORAL | 0 refills | Status: DC

## 2016-06-30 MED FILL — LEVOTHYROXINE 125 MCG TAB: 125 | 90 days supply | Qty: 90 | Fill #0

## 2016-06-30 NOTE — Telephone Encounter (Signed)
Pt came to pick up document ([facility]) pt had to get labs done (lab done today 06-30-2016) and documents were giving to pt (copies given to [place]).

## 2016-07-01 LAB — HEPATITIS B SURFACE ANTIBODY,QUALITATIVE

## 2016-07-03 ENCOUNTER — Telehealth: Payer: Self-pay | Admitting: Family

## 2016-07-03 NOTE — Telephone Encounter (Signed)
Hep B is "indeterminate."  I would recommend that she restart the hep B series as she does not have clear immunity.

## 2016-07-04 NOTE — Telephone Encounter (Signed)
Left detailed message on cell# and to call and schedule nurse visit to start the Hep B series.

## 2016-07-07 ENCOUNTER — Telehealth: Payer: Self-pay | Admitting: Family

## 2016-07-07 NOTE — Telephone Encounter (Signed)
-----   Message from Oneal GroutJennifer S Sebastian sent at 07/07/2016  1:32 PM EDT ----- Regarding: RE: us soft tissue head and neck Noted Thanks ----- Message ----- From: Eugene Garnethandler, Carolyn S Sent: 07/07/2016   1:21 PM To: Oneal GroutJennifer S Sebastian, Sandford CrazeMelissa O'Sullivan, NP, # Subject: us soft tissue head and neck                   We have left three messages to schedule the ultrasound.  Patient stated on 06/23/16 that she would call back to schedule.   She has not.  Since then we have called.  5/8 and 5/ 11.    Thanks, Eber Jonesarolyn

## 2016-08-11 ENCOUNTER — Other Ambulatory Visit: Payer: Self-pay | Admitting: Emergency Medicine

## 2016-08-11 DIAGNOSIS — E039 Hypothyroidism, unspecified: Secondary | ICD-10-CM

## 2016-08-12 ENCOUNTER — Other Ambulatory Visit: Payer: 59

## 2016-09-03 MED FILL — FLUCONAZOLE 150 MG TABLET: 150 | 3 days supply | Qty: 3 | Fill #0

## 2016-09-03 MED FILL — metroNIDAZOLE 0.75 % GEL: 0.75 | 5 days supply | Qty: 70 | Fill #0

## 2016-09-29 ENCOUNTER — Ambulatory Visit (INDEPENDENT_AMBULATORY_CARE_PROVIDER_SITE_OTHER): Payer: 59 | Admitting: Family

## 2016-09-29 ENCOUNTER — Encounter: Payer: Self-pay | Admitting: Family

## 2016-09-29 VITALS — BP 132/82 | HR 82 | Temp 98.4°F | Resp 16 | Ht 65.0 in | Wt 216.2 lb

## 2016-09-29 DIAGNOSIS — Z23 Encounter for immunization: Secondary | ICD-10-CM

## 2016-09-29 DIAGNOSIS — R739 Hyperglycemia, unspecified: Secondary | ICD-10-CM | POA: Diagnosis not present

## 2016-09-29 DIAGNOSIS — R030 Elevated blood-pressure reading, without diagnosis of hypertension: Secondary | ICD-10-CM | POA: Diagnosis not present

## 2016-09-29 DIAGNOSIS — E039 Hypothyroidism, unspecified: Secondary | ICD-10-CM | POA: Diagnosis not present

## 2016-09-29 DIAGNOSIS — F329 Major depressive disorder, single episode, unspecified: Secondary | ICD-10-CM

## 2016-09-29 DIAGNOSIS — F419 Anxiety disorder, unspecified: Secondary | ICD-10-CM

## 2016-09-29 LAB — TSH: TSH: 2.65 u[IU]/mL (ref 0.35–4.50)

## 2016-09-29 NOTE — Addendum Note (Signed)
Addended by: Mervin KungFERGERSON, Riddhi Grether A on: 09/29/2016 05:19 PM   Modules accepted: Orders

## 2016-09-29 NOTE — Progress Notes (Signed)
Subjective:    Patient ID: Sheila Evans, female    DOB: 12-04-1975, 41 y.o.   MRN: [id number]  HPI  Ms. Sheila Evans is a 41 yr old female who presents today for follow up.  1) Hyperglycemia-  Lab Results  Component Value Date   HGBA1C 5.8 06/23/2016   2) Anxiety/Depression- denies anxiety. Having some situational stress.  Denies anxiety.   3) Hypothyroid- reports that she has been taking synthroid regularly.   Lab Results  Component Value Date   TSH 8.03 (H) 06/23/2016    She has been working on weight loss.  She eats 16-18 oz protein, 2 cups veggies 2 cups fruit and 2 cups of veggies each day for 40 days.  Has lost 40 pounds and reports that she has maintained the weight loss. She plans to restart her weight loss efforts with a goal to lose another 40 pounds.   Wt Readings from Last 3 Encounters:  09/29/16 216 lb 3.2 oz (98.1 kg)  06/23/16 254 lb 9.6 oz (115.5 kg)  09/28/13 175 lb (79.4 kg)    4) elevated blood pressure reading- noted last visit and attributed to weight gain.  BP Readings from Last 3 Encounters:  09/29/16 132/82  06/23/16 116/80  09/28/13 122/74     Review of Systems See HPI  Past Medical History:  Diagnosis Date  . Abnormal Pap smear    leep, 3 normal since  . Gestational diabetes   . Headache(784.0)    teenager  . Hypertension   . Hypothyroid   . Infertility, female   . PP C/S 26w 5d 07/10/11 F (NICU) 07/11/2011     Social History   Social History  . Marital status: Married    Spouse name: N/A  . Number of children: N/A  . Years of education: N/A   Occupational History  . Not on file.   Social History Main Topics  . Smoking status: Current Every Day Smoker    Packs/day: 0.50    Types: Cigarettes  . Smokeless tobacco: Never Used  . Alcohol use Yes     Comment: 1 drink monthly  . Drug use: No  . Sexual activity: Yes    Partners: Male    Birth control/ protection: None     Comment: husband has zero sperm count   Other Topics  Concern  . 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 CERVICAL CERCLAGE  04/18/2011   Procedure: CERCLAGE CERVICAL;  Surgeon: Tresa Endo A. Ernestina Penna, MD;  Location: [facility];  Service: Gynecology;  Laterality: N/A;  . CERVICAL CERCLAGE  07/10/2011   Procedure: CERCLAGE CERVICAL;  Surgeon: Tresa Endo A. Ernestina Penna, MD;  Location: [facility];  Service: Gynecology;  Laterality: N/A;  removal  . CESAREAN SECTION  07/10/2011   Procedure: CESAREAN SECTION;  Surgeon: Tresa Endo A. Ernestina Penna, MD;  Location: [facility];  Service: Gynecology;  Laterality: N/A;  . LEEP    . LEEP    . TONSILLECTOMY      Family History  Problem Relation Age of Onset  . Diabetes Mother   . Hyperlipidemia Mother   . Heart attack Father   . Sudden death Father  55  . Anesthesia problems Neg Hx   . Hypertension Neg Hx   . Cancer Neg Hx     Allergies  Allergen Reactions  . Latex Rash    Current Outpatient Prescriptions on File Prior to Visit  Medication Sig Dispense Refill  . ibuprofen (ADVIL,MOTRIN) 600 MG tablet Take 600 mg by mouth every 6 (six) hours as needed for pain.    Marland Kitchen. levothyroxine (SYNTHROID, LEVOTHROID) 125 MCG tablet Take 1 tablet (125 mcg total) by mouth daily. 90 tablet 0   No current facility-administered medications on file prior to visit.     BP 132/82 (BP Location: Right Arm, Cuff Size: Large)   Pulse 82   Temp 98.4 F (36.9 C) (Oral)   Resp 16   Ht 5\' 5"  (1.651 m)   Wt 216 lb 3.2 oz (98.1 kg)   LMP 09/24/2016   SpO2 98%   BMI 35.98 kg/m       Objective:   Physical Exam  Constitutional: She appears well-developed and well-nourished.  Cardiovascular: Normal rate, regular rhythm and normal heart sounds.   No  murmur heard. Pulmonary/Chest: Effort normal and breath sounds normal. No respiratory distress. She has no wheezes.  Musculoskeletal: She exhibits no edema.  Neurological: She is alert.  Psychiatric: She has a normal mood and affect. Her behavior is normal. Judgment and thought content normal.          Assessment & Plan:  Hyperglycemia-last A1c was 5.8. I suspect that this is even better now that she has lost nearly 40 pounds. Encouraged her to continue her good work with healthy diet and weight loss. She also plans to add in some regular exercise.  Elevated blood pressure reading-blood pressure is acceptable today. We'll continue to monitor.  Hypothyroid-reports good compliance with Synthroid since last visit. Obtain follow-up TSH.  Anxiety/depression this is currently stable. She notes some situational stress but otherwise feels well.

## 2016-09-29 NOTE — Patient Instructions (Signed)
Please complete lab work prior to leaving.  Keep up the good work! Follow up in 1 month for nurse visit for Hep B # 2 and 6 months for office visit and hep B # 3.

## 2016-09-30 ENCOUNTER — Other Ambulatory Visit: Payer: Self-pay | Admitting: Family

## 2016-09-30 MED ORDER — LEVOTHYROXINE SODIUM 125 MCG PO TABS: 125.0000 ug | ORAL_TABLET | Freq: Every day | ORAL | 1 refills | Status: DC

## 2016-09-30 MED FILL — LEVOTHYROXINE 125 MCG TAB: 125 | 90 days supply | Qty: 90 | Fill #0

## 2016-10-22 DIAGNOSIS — H52223 Regular astigmatism, bilateral: Secondary | ICD-10-CM | POA: Diagnosis not present

## 2016-10-22 DIAGNOSIS — H5203 Hypermetropia, bilateral: Secondary | ICD-10-CM | POA: Diagnosis not present

## 2016-10-28 ENCOUNTER — Ambulatory Visit: Payer: 59

## 2016-10-28 DIAGNOSIS — F4323 Adjustment disorder with mixed anxiety and depressed mood: Secondary | ICD-10-CM | POA: Diagnosis not present

## 2016-12-08 DIAGNOSIS — A630 Anogenital (venereal) warts: Secondary | ICD-10-CM | POA: Diagnosis not present

## 2016-12-22 DIAGNOSIS — A630 Anogenital (venereal) warts: Secondary | ICD-10-CM | POA: Diagnosis not present

## 2016-12-22 MED FILL — IMIQUIMOD 5% CREAM PACKET: 5 | 56 days supply | Qty: 24 | Fill #0

## 2016-12-23 ENCOUNTER — Ambulatory Visit: Payer: 59

## 2017-01-06 MED FILL — LEVOTHYROXINE 125 MCG TAB: 125 | 90 days supply | Qty: 90 | Fill #1

## 2017-03-12 DIAGNOSIS — N926 Irregular menstruation, unspecified: Secondary | ICD-10-CM | POA: Diagnosis not present

## 2017-03-12 DIAGNOSIS — N921 Excessive and frequent menstruation with irregular cycle: Secondary | ICD-10-CM | POA: Diagnosis not present

## 2017-03-12 DIAGNOSIS — Z3200 Encounter for pregnancy test, result unknown: Secondary | ICD-10-CM | POA: Diagnosis not present

## 2017-03-13 MED FILL — NYSTATIN-TRIAMCINOLONE CRM: 100000-0.1 | 30 days supply | Qty: 30 | Fill #0

## 2017-03-30 ENCOUNTER — Ambulatory Visit: Payer: 59 | Admitting: Family

## 2017-03-30 ENCOUNTER — Encounter: Payer: Self-pay | Admitting: Family

## 2017-03-30 VITALS — BP 110/61 | HR 71 | Temp 98.3°F | Resp 16 | Ht 65.0 in | Wt 194.4 lb

## 2017-03-30 DIAGNOSIS — Z0000 Encounter for general adult medical examination without abnormal findings: Secondary | ICD-10-CM

## 2017-03-30 DIAGNOSIS — Z23 Encounter for immunization: Secondary | ICD-10-CM

## 2017-03-30 DIAGNOSIS — E039 Hypothyroidism, unspecified: Secondary | ICD-10-CM | POA: Diagnosis not present

## 2017-03-30 LAB — TSH: TSH: 1.78 u[IU]/mL (ref 0.35–4.50)

## 2017-03-30 NOTE — Progress Notes (Signed)
Subjective:    Patient ID: Sheila Evans, female    DOB: 1976-02-21, 42 y.o.   MRN: [id number]  HPI  Pt is a 42 yr old female who presents today for follow up.   Hypothyroid- she is maintained on synthroid.   Lab Results  Component Value Date   TSH 2.65 09/29/2016   Hep B-  Last visit Hep B immunity was indeterminate.  She received one booster shot but did not return for an additional vaccine.   Review of Systems See HPI  Past Medical History:  Diagnosis Date  . Abnormal Pap smear    leep, 3 normal since  . Gestational diabetes   . Headache(784.0)    teenager  . Hypertension   . Hypothyroid   . Infertility, female   . PP C/S 26w 5d 07/10/11 F (NICU) 07/11/2011     Social History   Socioeconomic History  . Marital status: Married    Spouse name: Not on file  . Number of children: Not on file  . Years of education: Not on file  . Highest education level: Not on file  Social Needs  . Financial resource strain: Not on file  . Food insecurity - worry: Not on file  . Food insecurity - inability: Not on file  . Transportation needs - medical: Not on file  . Transportation needs - non-medical: Not on file  Occupational History  . Not on file  Tobacco Use  . Smoking status: Current Every Day Smoker    Packs/day: 0.50    Types: Cigarettes  . Smokeless tobacco: Never Used  Substance and Sexual Activity  . Alcohol use: Yes    Comment: 1 drink monthly  . Drug use: No  . Sexual activity: Yes    Partners: Male    Birth control/protection: None    Comment: husband has zero sperm count  Other Topics Concern  . 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 CERVICAL CERCLAGE   04/18/2011   Procedure: CERCLAGE CERVICAL;  Surgeon: Tresa EndoKelly A. Ernestina PennaFogleman, MD;  Location: [facility];  Service: Gynecology;  Laterality: N/A;  . CERVICAL CERCLAGE  07/10/2011   Procedure: CERCLAGE CERVICAL;  Surgeon: Tresa EndoKelly A. Ernestina PennaFogleman, MD;  Location: [facility];  Service: Gynecology;  Laterality: N/A;  removal  . CESAREAN SECTION  07/10/2011   Procedure: CESAREAN SECTION;  Surgeon: Tresa EndoKelly A. Ernestina PennaFogleman, MD;  Location: [facility];  Service: Gynecology;  Laterality: N/A;  . LEEP    . LEEP    . TONSILLECTOMY      Family History  Problem Relation Age of Onset  . Diabetes Mother   . Hyperlipidemia Mother   . Heart attack Father   . Sudden death Father        3255  . Anesthesia problems Neg Hx   . Hypertension Neg Hx   . Cancer Neg Hx     Allergies  Allergen Reactions  . Latex Rash    Current Outpatient Medications on File Prior to Visit  Medication Sig Dispense Refill  . levothyroxine (SYNTHROID, LEVOTHROID) 125 MCG tablet Take 1 tablet (125 mcg total) by mouth daily. 90 tablet 1  . MAGNESIUM  CITRATE PO Take 250 mg by mouth 2 (two) times daily.     No current facility-administered medications on file prior to visit.     BP 110/61 (BP Location: Right Arm, Patient Position: Sitting, Cuff Size: Large)   Pulse 71   Temp 98.3 F (36.8 C) (Oral)   Resp 16   Ht 5\' 5"  (1.651 m)   Wt 194 lb 6.4 oz (88.2 kg)   SpO2 99%   BMI 32.35 kg/m       Objective:   Physical Exam  Constitutional: She is oriented to person, place, and time. She appears well-developed and well-nourished.  HENT:  Head: Normocephalic and atraumatic.  Neck: Neck supple. No thyromegaly present.  Cardiovascular: Normal rate, regular rhythm and normal heart sounds.  No murmur heard. Pulmonary/Chest: Effort normal and breath sounds normal. No respiratory distress. She has no wheezes.  Musculoskeletal: She exhibits no edema.  Lymphadenopathy:    She has no cervical adenopathy.  Neurological: She is alert and oriented to person,  place, and time.  Psychiatric: She has a normal mood and affect. Her behavior is normal. Judgment and thought content normal.          Assessment & Plan:  Hypothyroid-clinically stable. Continue synthroid. Will obtain follow up TSH.   Need for hep B vaccination- Hep B #2 today. Check titer.

## 2017-03-30 NOTE — Patient Instructions (Signed)
Please complete lab work prior to leaving.   

## 2017-03-31 ENCOUNTER — Encounter: Payer: Self-pay | Admitting: Family

## 2017-03-31 LAB — HEPATITIS B SURFACE ANTIBODY, QUANTITATIVE: Hepatitis B-Post: 343 m[IU]/mL (ref >=10)

## 2017-06-04 MED FILL — AMOXICILLIN 875 MG TABS: 875 | 10 days supply | Qty: 20 | Fill #0

## 2017-06-04 MED FILL — ACETAMINOPHEN/COD #3 TABLET: 300-30 | 4 days supply | Qty: 16 | Fill #0

## 2017-06-08 ENCOUNTER — Other Ambulatory Visit: Payer: Self-pay | Admitting: Family

## 2017-06-09 DIAGNOSIS — F4323 Adjustment disorder with mixed anxiety and depressed mood: Secondary | ICD-10-CM | POA: Diagnosis not present

## 2017-06-10 MED FILL — LEVOTHYROXINE 125 MCG TAB: 125 | 90 days supply | Qty: 90 | Fill #0

## 2017-06-25 DIAGNOSIS — F4323 Adjustment disorder with mixed anxiety and depressed mood: Secondary | ICD-10-CM | POA: Diagnosis not present

## 2017-06-29 ENCOUNTER — Encounter: Payer: 59 | Admitting: Family

## 2017-07-03 ENCOUNTER — Ambulatory Visit (INDEPENDENT_AMBULATORY_CARE_PROVIDER_SITE_OTHER): Payer: 59 | Admitting: Family

## 2017-07-03 ENCOUNTER — Encounter: Payer: Self-pay | Admitting: Family

## 2017-07-03 VITALS — BP 128/70 | HR 71 | Temp 98.9°F | Resp 16 | Ht 66.0 in | Wt 210.6 lb

## 2017-07-03 DIAGNOSIS — Z0000 Encounter for general adult medical examination without abnormal findings: Secondary | ICD-10-CM

## 2017-07-03 LAB — BASIC METABOLIC PANEL
BUN: 13 mg/dL (ref 6–23)
CALCIUM: 9.2 mg/dL (ref 8.4–10.5)
CO2: 27 mEq/L (ref 19–32)
Chloride: 106 mEq/L (ref 96–112)
Creatinine, Ser: 0.65 mg/dL (ref 0.40–1.20)
GFR: 107.55 mL/min (ref >60.00)
GLUCOSE: 86 mg/dL (ref 70–99)
Potassium: 4.3 mEq/L (ref 3.5–5.1)
Sodium: 139 mEq/L (ref 135–145)

## 2017-07-03 LAB — CBC WITH DIFFERENTIAL/PLATELET
Basophils Absolute: 0 10*3/uL (ref 0.0–0.1)
Basophils Relative: 0.1 % (ref 0.0–3.0)
Eosinophils Absolute: 0.2 10*3/uL (ref 0.0–0.7)
Eosinophils Relative: 2.4 % (ref 0.0–5.0)
HCT: 43.8 % (ref 36.0–46.0)
Hemoglobin: 14.8 g/dL (ref 12.0–15.0)
LYMPHS ABS: 2.3 10*3/uL (ref 0.7–4.0)
Lymphocytes Relative: 24.8 % (ref 12.0–46.0)
MCHC: 33.8 g/dL (ref 30.0–36.0)
MCV: 100.3 fl — ABNORMAL HIGH (ref 78.0–100.0)
MONO ABS: 0.7 10*3/uL (ref 0.1–1.0)
Monocytes Relative: 7.7 % (ref 3.0–12.0)
NEUTROS PCT: 65 % (ref 43.0–77.0)
Neutro Abs: 5.9 10*3/uL (ref 1.4–7.7)
Platelets: 282 10*3/uL (ref 150.0–400.0)
RBC: 4.37 Mil/uL (ref 3.87–5.11)
RDW: 13.8 % (ref 11.5–15.5)
WBC: 9.1 10*3/uL (ref 4.0–10.5)

## 2017-07-03 LAB — URINALYSIS, ROUTINE W REFLEX MICROSCOPIC
Bilirubin Urine: NEGATIVE
Hgb urine dipstick: NEGATIVE
Ketones, ur: NEGATIVE
Leukocytes, UA: NEGATIVE
Nitrite: NEGATIVE
RBC / HPF: NONE SEEN (ref 0–?)
Total Protein, Urine: NEGATIVE
URINE GLUCOSE: NEGATIVE
Urobilinogen, UA: 0.2 (ref 0.0–1.0)
WBC UA: NONE SEEN (ref 0–?)
pH: 6 (ref 5.0–8.0)

## 2017-07-03 LAB — HEPATIC FUNCTION PANEL
ALT: 13 U/L (ref 0–35)
AST: 13 U/L (ref 0–37)
Albumin: 4.1 g/dL (ref 3.5–5.2)
Alkaline Phosphatase: 38 U/L — ABNORMAL LOW (ref 39–117)
BILIRUBIN TOTAL: 0.6 mg/dL (ref 0.2–1.2)
Bilirubin, Direct: 0.1 mg/dL (ref 0.0–0.3)
Total Protein: 7 g/dL (ref 6.0–8.3)

## 2017-07-03 LAB — LIPID PANEL
CHOLESTEROL: 186 mg/dL (ref 0–200)
HDL: 45.3 mg/dL (ref >39.00)
LDL CALC: 118 mg/dL — AB (ref 0–99)
NonHDL: 141.04
Total CHOL/HDL Ratio: 4
Triglycerides: 116 mg/dL (ref 0.0–149.0)
VLDL: 23.2 mg/dL (ref 0.0–40.0)

## 2017-07-03 NOTE — Progress Notes (Signed)
Subjective:    Patient ID: Sheila Evans, female    DOB: November 24, 1975, 42 y.o.   MRN: [id number]  HPI  Patient presents today for complete physical.  Immunizations: tdap 2018 Diet:  Reports diet has "not been so great in the last 3 months"  Exercise: walks at work, no formal exercise Pap Smear: 08/23/12- GYN will follow up Mammogram: will be due in the fall    Review of Systems  Constitutional: Negative for unexpected weight change.  HENT: Negative for hearing loss and rhinorrhea.   Eyes: Negative for visual disturbance.  Respiratory: Negative for cough.   Cardiovascular: Negative for leg swelling.  Genitourinary: Negative for dysuria, frequency and hematuria.       Irregular menses- followed by Dr. Algie Coffer  Neurological: Negative for headaches.  Hematological: Negative for adenopathy.  Psychiatric/Behavioral:       Denies depression/anxiety   Past Medical History:  Diagnosis Date  . Abnormal Pap smear    leep, 3 normal since  . Gestational diabetes   . Headache(784.0)    teenager  . Hypertension   . Hypothyroid   . Infertility, female   . PP C/S 26w 5d 07/10/11 F (NICU) 07/11/2011     Social History   Socioeconomic History  . Marital status: Married    Spouse name: Not on file  . Number of children: Not on file  . Years of education: Not on file  . Highest education level: Not on file  Occupational History  . Not on file  Social Needs  . Financial resource strain: Not on file  . Food insecurity:    Worry: Not on file    Inability: Not on file  . Transportation needs:    Medical: Not on file    Non-medical: Not on file  Tobacco Use  . Smoking status: Current Every Day Smoker    Packs/day: 0.50    Types: Cigarettes  . Smokeless tobacco: Never Used  Substance and Sexual Activity  . Alcohol use: Yes    Comment: 1 drink monthly  . Drug use: No  . Sexual activity: Yes    Partners: Male    Birth control/protection: None    Comment: husband has zero  sperm count  Lifestyle  . Physical activity:    Days per week: Not on file    Minutes per session: Not on file  . Stress: Not on file  Relationships  . Social connections:    Talks on phone: Not on file    Gets together: Not on file    Attends religious service: Not on file    Active member of club or organization: Not on file    Attends meetings of clubs or organizations: Not on file    Relationship status: Not on file  . Intimate partner violence:    Fear of current or ex partner: Not on file    Emotionally abused: Not on file    Physically abused: Not on file    Forced sexual activity: Not on file  Other Topics Concern  . 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 CERVICAL CERCLAGE  04/18/2011   Procedure: CERCLAGE CERVICAL;  Surgeon: Alphonsus Sias. Ernestina Penna, MD;  Location: [facility];  Service: Gynecology;  Laterality: N/A;  . CERVICAL CERCLAGE  07/10/2011   Procedure: CERCLAGE CERVICAL;  Surgeon: Tresa Endo A. Ernestina Penna, MD;  Location: [facility];  Service: Gynecology;  Laterality: N/A;  removal  . CESAREAN SECTION  07/10/2011   Procedure: CESAREAN SECTION;  Surgeon: Tresa Endo A. Ernestina Penna, MD;  Location: [facility];  Service: Gynecology;  Laterality: N/A;  . LEEP    . LEEP    . TONSILLECTOMY      Family History  Problem Relation Age of Onset  . Diabetes Mother   . Hyperlipidemia Mother   . Heart attack Father   . Sudden death Father        29  . Anesthesia problems Neg Hx   . Hypertension Neg Hx   . Cancer Neg Hx     Allergies  Allergen Reactions  . Latex Rash    Current Outpatient Medications on File Prior to Visit  Medication Sig Dispense Refill  . levothyroxine (SYNTHROID, LEVOTHROID) 125 MCG  tablet TAKE 1 TABLET (125 MCG TOTAL) BY MOUTH DAILY. 90 tablet 1  . MAGNESIUM CITRATE PO Take 250 mg by mouth 2 (two) times daily.     No current facility-administered medications on file prior to visit.     BP 128/70 (BP Location: Right Arm, Patient Position: Sitting, Cuff Size: Large)   Pulse 71   Temp 98.9 F (37.2 C) (Oral)   Resp 16   Ht  (1.676 m)   Wt 210 lb 9.6 oz (95.5 kg)   SpO2 100%   BMI 33.99 kg/m        Objective:   Physical Exam Physical Exam  Constitutional: Overweight white female. She is oriented to person, place, and time. She appears well-developed and well-nourished. No distress.  HENT:  Head: Normocephalic and atraumatic.  Right Ear: Tympanic membrane and ear canal normal. (new cartilage piercing right ear, minimal swelling noted, no drainage) Left Ear: Tympanic membrane and ear canal normal.  Mouth/Throat: Oropharynx is clear and moist.  Eyes: Pupils are equal, round, and reactive to light. No scleral icterus.  Neck: Normal range of motion. No thyromegaly present.  Cardiovascular: Normal rate and regular rhythm.   No murmur heard. Pulmonary/Chest: Effort normal and breath sounds normal. No respiratory distress. He has no wheezes. She has no rales. She exhibits no tenderness.  Abdominal: Soft. Bowel sounds are normal. She exhibits no distension and no mass. There is no tenderness. There is no rebound and no guarding.  Musculoskeletal: She exhibits no edema.  Lymphadenopathy:    She has no cervical adenopathy.  Neurological: She is alert and oriented to person, place, and time. She has normal patellar reflexes. She exhibits normal muscle tone. Coordination normal.  Skin: Skin is warm and dry.  Psychiatric: She has a normal mood and affect. Her behavior is normal. Judgment and thought content normal.  Breast/pelvic: deferred to GYN     Assessment & Plan:   Preventative care- discussed healthy diet, exercise, weight loss. Obtain routine lab work.  Immunizations up to date. Pap is due and she will follow up with her GYN for this.     Assessment & Plan:

## 2017-07-03 NOTE — Patient Instructions (Signed)
Please complete lab work prior to leaving.   

## 2017-07-08 DIAGNOSIS — F4323 Adjustment disorder with mixed anxiety and depressed mood: Secondary | ICD-10-CM | POA: Diagnosis not present

## 2017-07-13 DIAGNOSIS — F4323 Adjustment disorder with mixed anxiety and depressed mood: Secondary | ICD-10-CM | POA: Diagnosis not present

## 2017-08-13 DIAGNOSIS — F4323 Adjustment disorder with mixed anxiety and depressed mood: Secondary | ICD-10-CM | POA: Diagnosis not present

## 2017-08-14 ENCOUNTER — Encounter: Payer: Self-pay | Admitting: Family Medicine

## 2017-08-14 ENCOUNTER — Ambulatory Visit: Payer: 59 | Admitting: Family Medicine

## 2017-08-14 VITALS — BP 104/74 | HR 87 | Temp 99.6°F | Ht 66.0 in | Wt 215.2 lb

## 2017-08-14 DIAGNOSIS — L03221 Cellulitis of neck: Secondary | ICD-10-CM

## 2017-08-14 MED ORDER — DOXYCYCLINE HYCLATE 100 MG PO TABS: 100.0000 mg | ORAL_TABLET | Freq: Two times a day (BID) | ORAL | 0 refills | Status: DC

## 2017-08-14 MED ORDER — LIDOCAINE 5 % EX OINT: 1.0000 "application " | TOPICAL_OINTMENT | Freq: Three times a day (TID) | CUTANEOUS | 0 refills | Status: DC | PRN

## 2017-08-14 MED FILL — LIDOCAINE 3% CREAM: 3 | 30 days supply | Qty: 85 | Fill #0

## 2017-08-14 MED FILL — DOXYCYCLINE HYCLATE 100 MG: 100 | 10 days supply | Qty: 20 | Fill #0

## 2017-08-14 NOTE — Progress Notes (Signed)
Pre visit review using our clinic review tool, if applicable. No additional management support is needed unless otherwise documented below in the visit note. 

## 2017-08-14 NOTE — Patient Instructions (Signed)
Things to look out for: increasing pain not relieved by ibuprofen/acetaminophen, fevers, spreading redness, drainage of pus, or foul odor.  Ice/cold pack over area for 10-15 min twice daily.  Let us know if you need anything.

## 2017-08-14 NOTE — Progress Notes (Signed)
Chief Complaint  Patient presents with  . Hair/Scalp Problem    ingrown hair    Sheila Evans is a 42 y.o. female here for a skin complaint.  Duration: 2 days Location: back of scalp/neck Pruritic? No Painful? Yes Drainage? No  Other associated symptoms: pain, redness Therapies tried thus far: heat, bactroban, she stuck a needle in it, and various other remedies  ROS:  Const: No fevers Skin: As noted in HPI  Past Medical History:  Diagnosis Date  . Abnormal Pap smear    leep, 3 normal since  . Gestational diabetes   . Headache(784.0)    teenager  . Hypertension   . Hypothyroid   . Infertility, female   . PP C/S 26w 5d 07/10/11 F (NICU) 07/11/2011   BP 104/74 (BP Location: Left Arm, Patient Position: Sitting, Cuff Size: Large)   Pulse 87   Temp 99.6 F (37.6 C) (Oral)   Ht 5\' 6"  (1.676 m)   Wt 215 lb 4 oz (97.6 kg)   SpO2 97%   BMI 34.74 kg/m  Gen: awake, alert, appearing stated age Lungs: No accessory muscle use Skin: See below. +TTP, erythema, central excoriation. It is indurated. No fluctuance.  Psych: Age appropriate judgment and insight   R scalp, occipital region   Cellulitis of neck - Plan: doxycycline (VIBRA-TABS) 100 MG tablet, lidocaine (XYLOCAINE) 5 % ointment  Orders as above. Warning signs and symptoms verbalized and written down in AVS.  F/u prn. The patient voiced understanding and agreement to the plan.  Jilda Rocheicholas Paul Ard[place], DO 08/14/17 1:59 PM

## 2017-08-18 ENCOUNTER — Encounter: Payer: Self-pay | Admitting: Family Medicine

## 2017-08-26 DIAGNOSIS — F4323 Adjustment disorder with mixed anxiety and depressed mood: Secondary | ICD-10-CM | POA: Diagnosis not present

## 2017-09-10 DIAGNOSIS — F411 Generalized anxiety disorder: Secondary | ICD-10-CM | POA: Diagnosis not present

## 2017-09-18 DIAGNOSIS — F4323 Adjustment disorder with mixed anxiety and depressed mood: Secondary | ICD-10-CM | POA: Diagnosis not present

## 2017-10-01 DIAGNOSIS — F4323 Adjustment disorder with mixed anxiety and depressed mood: Secondary | ICD-10-CM | POA: Diagnosis not present

## 2017-10-05 ENCOUNTER — Ambulatory Visit: Payer: 59 | Admitting: Family

## 2017-10-05 ENCOUNTER — Encounter: Payer: Self-pay | Admitting: Family

## 2017-10-05 VITALS — BP 131/66 | HR 75 | Temp 99.4°F | Resp 16 | Ht 66.0 in | Wt 221.0 lb

## 2017-10-05 DIAGNOSIS — F329 Major depressive disorder, single episode, unspecified: Secondary | ICD-10-CM

## 2017-10-05 DIAGNOSIS — F32A Depression, unspecified: Secondary | ICD-10-CM

## 2017-10-05 MED ORDER — BUPROPION HCL 100 MG PO TABS
ORAL_TABLET | ORAL | 0 refills | Status: DC
Start: 2017-10-05 — End: 2017-11-03

## 2017-10-05 MED FILL — buPROPion HCL 100 MG TABS: 100 | 34 days supply | Qty: 90 | Fill #0

## 2017-10-05 NOTE — Patient Instructions (Signed)
Please begin wellbutrin.  

## 2017-10-05 NOTE — Progress Notes (Signed)
Subjective:    Patient ID: Sheila Evans, female    DOB: [birth year], 42 y.o.   MRN: [id number]  HPI   Ms. Sheila Evans is a 42 yr old female who presents today for follow up of her mood swings.  Reports feelings of sadness at times.   Reports "10 good days a month."  During these days she is more active, has good libido and feels happy.  On the other days of the month she finds herself more emotional, has periods of "staring"  And after ovulation her symptoms worsen.  Feels like her emotions are exaggerated.  [place].    Review of Systems See HPI  Past Medical History:  Diagnosis Date  . Abnormal Pap smear    leep, 3 normal since  . Gestational diabetes   . Headache(784.0)    teenager  . Hypertension   . Hypothyroid   . Infertility, female   . PP C/S 26w 5d 07/10/11 F (NICU) 07/11/2011     Social History   Socioeconomic History  . Marital status: Married    Spouse name: Not on file  . Number of children: Not on file  . Years of education: Not on file  . Highest education level: Not on file  Occupational History  . Not on file  Social Needs  . Financial resource strain: Not on file  . Food insecurity:    Worry: Not on file    Inability: Not on file  . Transportation needs:    Medical: Not on file    Non-medical: Not on file  Tobacco Use  . Smoking status: Current Every Day Smoker    Packs/day: 0.50    Types: Cigarettes  . Smokeless tobacco: Never Used  Substance and Sexual Activity  . Alcohol use: Yes    Comment: 1 drink monthly  . Drug use: No  . Sexual activity: Yes    Partners: Male    Birth control/protection: None    Comment: husband has zero sperm count  Lifestyle  . Physical activity:    Days per week: Not on file    Minutes per session: Not on file  . Stress: Not on file  Relationships  . Social connections:    Talks on phone: Not on file    Gets together: Not on file    Attends religious service: Not on file    Active  member of club or organization: Not on file    Attends meetings of clubs or organizations: Not on file    Relationship status: Not on file  . Intimate partner violence:    Fear of current or ex partner: Not on file    Emotionally abused: Not on file    Physically abused: Not on file    Forced sexual activity: Not on file  Other Topics Concern  . 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 CERVICAL CERCLAGE  04/18/2011   Procedure: CERCLAGE CERVICAL;  Surgeon: Tresa EndoKelly A. Ernestina PennaFogleman, MD;  Location: [facility];  Service: Gynecology;  Laterality: N/A;  . CERVICAL CERCLAGE  07/10/2011   Procedure: CERCLAGE CERVICAL;  Surgeon: Tresa EndoKelly A. Ernestina PennaFogleman, MD;  Location: [facility];  Service: Gynecology;  Laterality: N/A;  removal  . CESAREAN SECTION  07/10/2011   Procedure: CESAREAN SECTION;  Surgeon: Tresa EndoKelly A. Ernestina PennaFogleman, MD;  Location: [facility];  Service: Gynecology;  Laterality: N/A;  . LEEP    . LEEP    . TONSILLECTOMY      Family History  Problem Relation Age of Onset  . Diabetes Mother   . Hyperlipidemia Mother   . Heart attack Father   . Sudden death Father        9355  . Anesthesia problems Neg Hx   . Hypertension Neg Hx   . Cancer Neg Hx     Allergies  Allergen Reactions  . Latex Rash    Current Outpatient Medications on File Prior to Visit  Medication Sig Dispense Refill  . levothyroxine (SYNTHROID, LEVOTHROID) 125 MCG tablet TAKE 1 TABLET (125 MCG TOTAL) BY MOUTH DAILY. 90 tablet 1   No current facility-administered medications on file prior to visit.     BP 131/66 (BP Location: Right Arm, Patient Position: Sitting, Cuff Size: Large)   Pulse 75   Temp 99.4 F (37.4 C) (Oral)   Resp 16   Ht 5\' 6"  (1.676  m)   Wt 221 lb (100.2 kg)   LMP 09/14/2017 (Approximate)   SpO2 99%   BMI 35.67 kg/m       Objective:   Physical Exam  Constitutional: She appears well-developed and well-nourished.  Cardiovascular: Normal rate, regular rhythm and normal heart sounds.  No murmur heard. Musculoskeletal: She exhibits no edema.  Psychiatric: She has a normal mood and affect. Her behavior is normal. Judgment and thought content normal.          Assessment & Plan:  Depression-  Uncontrolled. Seems to be cyclic with her cycle.  She is not a candidate for OCP's since she is smoker.  Will give her a trial of wellbutrin. She is advised to let us know if worsening depression symptoms. A total of 15  minutes were spent face-to-face with the patient during this encounter and over half of that time was spent on counseling and coordination of care. The patient was counseled on depression and med management options for depression.

## 2017-10-24 DIAGNOSIS — S90121A Contusion of right lesser toe(s) without damage to nail, initial encounter: Secondary | ICD-10-CM | POA: Diagnosis not present

## 2017-11-02 DIAGNOSIS — F411 Generalized anxiety disorder: Secondary | ICD-10-CM | POA: Diagnosis not present

## 2017-11-03 ENCOUNTER — Ambulatory Visit: Payer: 59 | Admitting: Family

## 2017-11-03 ENCOUNTER — Encounter: Payer: Self-pay | Admitting: Family

## 2017-11-03 VITALS — BP 117/68 | HR 73 | Temp 99.3°F | Resp 16 | Ht 66.0 in | Wt 223.6 lb

## 2017-11-03 DIAGNOSIS — Z720 Tobacco use: Secondary | ICD-10-CM

## 2017-11-03 DIAGNOSIS — F32A Depression, unspecified: Secondary | ICD-10-CM

## 2017-11-03 DIAGNOSIS — F329 Major depressive disorder, single episode, unspecified: Secondary | ICD-10-CM | POA: Diagnosis not present

## 2017-11-03 MED ORDER — BUPROPION HCL ER (XL) 300 MG PO TB24: 300.0000 mg | ORAL_TABLET | Freq: Every day | ORAL | 1 refills | Status: DC

## 2017-11-03 MED FILL — buPROPion HCL ER (XL) 300 M: 300 | 90 days supply | Qty: 90 | Fill #0

## 2017-11-03 NOTE — Progress Notes (Signed)
Subjective:    Patient ID: Sheila Evans, female    DOB: September 26, 1975, 42 y.o.   MRN: [id number]  HPI  Patient is a 42 yr old female who presents today for follow up of her depression. Last visit we added wellbutrin to her regimen. Reports that her symptoms improved this cycle.  She reports that she feels more aware of her self getting overly angry about things and is better able to control her emotions.  She continues to vape and smokes very few cigarettes.  She is not quite ready to quit vaping.  Depression screen [facility] 2/9 11/03/2017 09/29/2016 02/23/2013  Decreased Interest 0 0 0  Down, Depressed, Hopeless 0 1 0  PHQ - 2 Score 0 1 0  Altered sleeping 1 0 -  Tired, decreased energy 0 0 -  Change in appetite 1 0 -  Feeling bad or failure about yourself  0 0 -  Trouble concentrating - 1 -  Moving slowly or fidgety/restless 0 0 -  Suicidal thoughts 0 0 -  PHQ-9 Score 2 2 -   She will have a deep cleaning under her gums.  She reports that she has a lot of anxiety surrounding dental procedures.  She will be receiving nitrous oxide and reports it would be cheaper for her if I provided her with a tablet of Valium as well. Review of Systems See HPI   Past Medical History:  Diagnosis Date  . Abnormal Pap smear    leep, 3 normal since  . Gestational diabetes   . Headache(784.0)    teenager  . Hypertension   . Hypothyroid   . Infertility, female   . PP C/S 26w 5d 07/10/11 F (NICU) 07/11/2011     Social History   Socioeconomic History  . Marital status: Married    Spouse name: Not on file  . Number of children: Not on file  . Years of education: Not on file  . Highest education level: Not on file  Occupational History  . Not on file  Social Needs  . Financial resource strain: Not on file  . Food insecurity:    Worry: Not on file    Inability: Not on file  . Transportation needs:    Medical: Not on file    Non-medical: Not on file  Tobacco Use  . Smoking status: Current  Every Day Smoker    Packs/day: 0.50    Types: Cigarettes  . Smokeless tobacco: Never Used  Substance and Sexual Activity  . Alcohol use: Yes    Comment: 1 drink monthly  . Drug use: No  . Sexual activity: Yes    Partners: Male    Birth control/protection: None    Comment: husband has zero sperm count  Lifestyle  . Physical activity:    Days per week: Not on file    Minutes per session: Not on file  . Stress: Not on file  Relationships  . Social connections:    Talks on phone: Not on file    Gets together: Not on file    Attends religious service: Not on file    Active member of club or organization: Not on file    Attends meetings of clubs or organizations: Not on file    Relationship status: Not on file  . Intimate partner violence:    Fear of current or ex partner: Not on file    Emotionally abused: Not on file    Physically abused: Not on file  Forced sexual activity: Not on file  Other Topics Concern  . 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 CERVICAL CERCLAGE  04/18/2011   Procedure: CERCLAGE CERVICAL;  Surgeon: Tresa Endo A. Ernestina Penna, MD;  Location: [facility];  Service: Gynecology;  Laterality: N/A;  . CERVICAL CERCLAGE  07/10/2011   Procedure: CERCLAGE CERVICAL;  Surgeon: Tresa Endo A. Ernestina Penna, MD;  Location: [facility];  Service: Gynecology;  Laterality: N/A;  removal  . CESAREAN SECTION  07/10/2011   Procedure: CESAREAN SECTION;  Surgeon: Tresa Endo A. Ernestina Penna, MD;  Location: [facility];  Service: Gynecology;  Laterality: N/A;  . LEEP    . LEEP    . TONSILLECTOMY      Family History  Problem Relation Age of Onset  . Diabetes Mother   . Hyperlipidemia Mother   . Heart attack Father   .  Sudden death Father        62  . Anesthesia problems Neg Hx   . Hypertension Neg Hx   . Cancer Neg Hx     Allergies  Allergen Reactions  . Latex Rash    Current Outpatient Medications on File Prior to Visit  Medication Sig Dispense Refill  . buPROPion (WELLBUTRIN) 100 MG tablet 100 mg twice daily; after 3 days may increase to the usual dose of 100 mg 3 times a day 90 tablet 0  . levothyroxine (SYNTHROID, LEVOTHROID) 125 MCG tablet TAKE 1 TABLET (125 MCG TOTAL) BY MOUTH DAILY. 90 tablet 1   No current facility-administered medications on file prior to visit.     BP 117/68 (BP Location: Right Arm, Patient Position: Sitting, Cuff Size: Large)   Pulse 73   Temp 99.3 F (37.4 C) (Oral)   Resp 16   Ht 5\' 6"  (1.676 m)   Wt 223 lb 9.6 oz (101.4 kg)   LMP 10/30/2017   SpO2 98%   BMI 36.09 kg/m       Objective:   Physical Exam  Constitutional: She is oriented to person, place, and time. She appears well-developed and well-nourished.  Neurological: She is alert and oriented to person, place, and time.  Skin: Skin is warm and dry.  Psychiatric: She has a normal mood and affect. Her behavior is normal. Judgment and thought content normal.          Assessment & Plan:   Depression-this seems to be improving with the Wellbutrin.  We will change her from 100 mg 3 times daily to 300 mg extended release once daily for ease of administration.  Tobacco abuse-reinforced importance of cessation.  I declined to provide her with a Valium today since this will be mixed with nitrous oxide.  I suggested that she receive this from her dentist instead since they will be the ones monitoring her during the procedure.  She verbalizes understanding.

## 2017-11-03 NOTE — Patient Instructions (Signed)
Please change wellbutrin to 300mg  xl once daily.

## 2017-11-10 DIAGNOSIS — H524 Presbyopia: Secondary | ICD-10-CM | POA: Diagnosis not present

## 2017-11-10 DIAGNOSIS — H5203 Hypermetropia, bilateral: Secondary | ICD-10-CM | POA: Diagnosis not present

## 2017-11-10 DIAGNOSIS — H52223 Regular astigmatism, bilateral: Secondary | ICD-10-CM | POA: Diagnosis not present

## 2017-11-16 DIAGNOSIS — F330 Major depressive disorder, recurrent, mild: Secondary | ICD-10-CM | POA: Diagnosis not present

## 2017-11-30 DIAGNOSIS — F4323 Adjustment disorder with mixed anxiety and depressed mood: Secondary | ICD-10-CM | POA: Diagnosis not present

## 2017-12-04 ENCOUNTER — Encounter: Payer: Self-pay | Admitting: Family

## 2017-12-09 ENCOUNTER — Ambulatory Visit: Payer: 59 | Admitting: Family

## 2017-12-09 ENCOUNTER — Encounter: Payer: Self-pay | Admitting: Family

## 2017-12-09 VITALS — BP 124/69 | HR 84 | Temp 98.9°F | Resp 16 | Ht 66.0 in | Wt 228.0 lb

## 2017-12-09 DIAGNOSIS — F418 Other specified anxiety disorders: Secondary | ICD-10-CM | POA: Diagnosis not present

## 2017-12-09 MED ORDER — ESCITALOPRAM OXALATE 10 MG PO TABS: ORAL_TABLET | ORAL | 0 refills | Status: DC

## 2017-12-09 MED FILL — LEVOTHYROXINE 125 MCG TAB: 125 | 90 days supply | Qty: 90 | Fill #1

## 2017-12-09 MED FILL — ESCITALOPRAM 10 MG TABLET: 10 | 30 days supply | Qty: 30 | Fill #0

## 2017-12-09 NOTE — Progress Notes (Signed)
Subjective:    Patient ID: Sheila Evans, female    DOB: 1976-02-22, 42 y.o.   MRN: [id number]  HPI  Patient is a 42 year old female who presents for follow-up of her depression.  We last saw her in September.  At that time her depression symptoms seem to be improving with the recent addition of Wellbutrin.  We changed her from 100 mg 3 times daily to 300 mg extended release once daily.   Reports that she reports "little panic attacks."  Having bursts of crying, an that makes her anxious.  Notes that I she "almost gets obessive" about her negative thoughts. Not sleeping well.  Notes that she has increased hunger some days, less appetite other days.  Reports that "I can cry at the drop of a hat."  Continues to have increased symptoms around ovulation, and right before her period.  Reports that she has tried cymbalta in the past.    Review of Systems  See HPI  Past Medical History:  Diagnosis Date  . Abnormal Pap smear    leep, 3 normal since  . Gestational diabetes   . Headache(784.0)    teenager  . Hypertension   . Hypothyroid   . Infertility, female   . PP C/S 26w 5d 07/10/11 F (NICU) 07/11/2011     Social History   Socioeconomic History  . Marital status: Married    Spouse name: Not on file  . Number of children: Not on file  . Years of education: Not on file  . Highest education level: Not on file  Occupational History  . Not on file  Social Needs  . Financial resource strain: Not on file  . Food insecurity:    Worry: Not on file    Inability: Not on file  . Transportation needs:    Medical: Not on file    Non-medical: Not on file  Tobacco Use  . Smoking status: Current Every Day Smoker    Packs/day: 0.50    Types: Cigarettes  . Smokeless tobacco: Never Used  Substance and Sexual Activity  . Alcohol use: Yes    Comment: 1 drink monthly  . Drug use: No  . Sexual activity: Yes    Partners: Male    Birth control/protection: None    Comment: husband has zero  sperm count  Lifestyle  . Physical activity:    Days per week: Not on file    Minutes per session: Not on file  . Stress: Not on file  Relationships  . Social connections:    Talks on phone: Not on file    Gets together: Not on file    Attends religious service: Not on file    Active member of club or organization: Not on file    Attends meetings of clubs or organizations: Not on file    Relationship status: Not on file  . Intimate partner violence:    Fear of current or ex partner: Not on file    Emotionally abused: Not on file    Physically abused: Not on file    Forced sexual activity: Not on file  Other Topics Concern  . 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 CERVICAL CERCLAGE  04/18/2011   Procedure: CERCLAGE CERVICAL;  Surgeon: Tresa Endo A. Ernestina Penna, MD;  Location: [facility];  Service: Gynecology;  Laterality: N/A;  . CERVICAL CERCLAGE  07/10/2011   Procedure: CERCLAGE CERVICAL;  Surgeon: Tresa Endo A. Ernestina Penna, MD;  Location: [facility];  Service: Gynecology;  Laterality: N/A;  removal  . CESAREAN SECTION  07/10/2011   Procedure: CESAREAN SECTION;  Surgeon: Tresa Endo A. Ernestina Penna, MD;  Location: [facility];  Service: Gynecology;  Laterality: N/A;  . LEEP    . LEEP    . TONSILLECTOMY      Family History  Problem Relation Age of Onset  . Diabetes Mother   . Hyperlipidemia Mother   . Heart attack Father   . Sudden death Father        33  . Anesthesia problems Neg Hx   . Hypertension Neg Hx   . Cancer Neg Hx     Allergies  Allergen Reactions  . Latex Rash    Current Outpatient Medications on File Prior to Visit  Medication Sig Dispense Refill  . buPROPion (WELLBUTRIN XL) 300 MG 24 hr tablet Take  1 tablet (300 mg total) by mouth daily. 90 tablet 1  . levothyroxine (SYNTHROID, LEVOTHROID) 125 MCG tablet TAKE 1 TABLET (125 MCG TOTAL) BY MOUTH DAILY. 90 tablet 1   No current facility-administered medications on file prior to visit.     BP 124/69 (BP Location: Right Arm, Patient Position: Sitting, Cuff Size: Large)   Pulse 84   Temp 98.9 F (37.2 C) (Oral)   Resp 16   Ht 5\' 6"  (1.676 m)   Wt 228 lb (103.4 kg)   LMP 11/19/2017 (Approximate)   SpO2 100%   BMI 36.80 kg/m        Objective:   Physical Exam  Constitutional: She is oriented to person, place, and time. She appears well-developed and well-nourished.  Musculoskeletal: She exhibits no edema.  Neurological: She is alert and oriented to person, place, and time.  Skin: Skin is warm and dry.  Psychiatric: She has a normal mood and affect. Her behavior is normal. Judgment and thought content normal.          Assessment & Plan:  Anxiety/depression-uncontrolled.  We will plan to continue Wellbutrin and add Lexapro to her regimen.  She is advised to begin half tablet once daily for 1 to 2 weeks and then increase to full tab once daily if symptoms are not significantly improved.   A total of 15 minutes were spent face-to-face with the patient during this encounter and over half of that time was spent on counseling and coordination of care. The patient was counseled on depression/anxiety/treatment.

## 2017-12-09 NOTE — Patient Instructions (Signed)
Please begin lexapro 1/2 tab once daily for 1 week, then increase as needed to full tab in 1-2 weeks.

## 2017-12-10 ENCOUNTER — Encounter: Payer: Self-pay | Admitting: Family

## 2017-12-22 DIAGNOSIS — F4323 Adjustment disorder with mixed anxiety and depressed mood: Secondary | ICD-10-CM | POA: Diagnosis not present

## 2017-12-28 DIAGNOSIS — H40033 Anatomical narrow angle, bilateral: Secondary | ICD-10-CM | POA: Diagnosis not present

## 2017-12-29 DIAGNOSIS — A630 Anogenital (venereal) warts: Secondary | ICD-10-CM | POA: Diagnosis not present

## 2018-01-13 DIAGNOSIS — A630 Anogenital (venereal) warts: Secondary | ICD-10-CM | POA: Diagnosis not present

## 2018-01-13 DIAGNOSIS — N9089 Other specified noninflammatory disorders of vulva and perineum: Secondary | ICD-10-CM | POA: Diagnosis not present

## 2018-01-14 ENCOUNTER — Encounter: Payer: Self-pay | Admitting: Family

## 2018-01-14 MED ORDER — ESCITALOPRAM OXALATE 10 MG PO TABS: ORAL_TABLET | ORAL | 1 refills | Status: DC

## 2018-01-14 MED FILL — ESCITALOPRAM 10 MG TABLET: 10 | 30 days supply | Qty: 30 | Fill #0

## 2018-02-10 DIAGNOSIS — Z1151 Encounter for screening for human papillomavirus (HPV): Secondary | ICD-10-CM | POA: Diagnosis not present

## 2018-02-10 DIAGNOSIS — A630 Anogenital (venereal) warts: Secondary | ICD-10-CM | POA: Diagnosis not present

## 2018-02-10 DIAGNOSIS — Z1231 Encounter for screening mammogram for malignant neoplasm of breast: Secondary | ICD-10-CM | POA: Diagnosis not present

## 2018-02-10 DIAGNOSIS — Z01419 Encounter for gynecological examination (general) (routine) without abnormal findings: Secondary | ICD-10-CM | POA: Diagnosis not present

## 2018-02-10 DIAGNOSIS — Z6838 Body mass index (BMI) 38.0-38.9, adult: Secondary | ICD-10-CM | POA: Diagnosis not present

## 2018-02-10 LAB — HM MAMMOGRAPHY

## 2018-02-11 ENCOUNTER — Other Ambulatory Visit: Payer: Self-pay | Admitting: Obstetrics

## 2018-02-11 DIAGNOSIS — R922 Inconclusive mammogram: Secondary | ICD-10-CM

## 2018-02-11 LAB — HM MAMMOGRAPHY: HM Mammogram: ABNORMAL — AB (ref 0–4)

## 2018-02-16 ENCOUNTER — Telehealth: Payer: Self-pay | Admitting: *Deleted

## 2018-02-16 NOTE — Telephone Encounter (Signed)
[facility], Impression: Left breast nodular density. Follow-up spot compression diagnostic mammogram with possible US recommended; forwarded to provider/SLS 12/24

## 2018-02-19 MED FILL — ESCITALOPRAM 10 MG TABLET: 10 | 30 days supply | Qty: 30 | Fill #1

## 2018-02-19 MED FILL — buPROPion HCL ER (XL) 300 M: 300 | 90 days supply | Qty: 90 | Fill #1

## 2018-02-22 ENCOUNTER — Ambulatory Visit
Admission: RE | Admit: 2018-02-22 | Discharge: 2018-02-22 | Disposition: A | Discharge status: Home / Self Care | Payer: 59 | Source: Ambulatory Visit | Attending: Obstetrics | Admitting: Obstetrics

## 2018-02-22 ENCOUNTER — Other Ambulatory Visit: Payer: Self-pay | Admitting: Obstetrics

## 2018-02-22 DIAGNOSIS — R928 Other abnormal and inconclusive findings on diagnostic imaging of breast: Secondary | ICD-10-CM | POA: Diagnosis not present

## 2018-02-22 DIAGNOSIS — N6489 Other specified disorders of breast: Secondary | ICD-10-CM | POA: Diagnosis not present

## 2018-02-22 DIAGNOSIS — R922 Inconclusive mammogram: Secondary | ICD-10-CM

## 2018-02-23 ENCOUNTER — Telehealth: Payer: Self-pay | Admitting: Family

## 2018-02-25 NOTE — Telephone Encounter (Signed)
See my chart message

## 2018-03-16 ENCOUNTER — Encounter: Payer: Self-pay | Admitting: Family

## 2018-03-18 MED ORDER — ESCITALOPRAM OXALATE 10 MG PO TABS: ORAL_TABLET | ORAL | 0 refills | Status: DC

## 2018-03-18 MED FILL — ESCITALOPRAM 10 MG TABLET: 10 | 30 days supply | Qty: 30 | Fill #0

## 2018-03-26 ENCOUNTER — Ambulatory Visit (INDEPENDENT_AMBULATORY_CARE_PROVIDER_SITE_OTHER): Payer: No Typology Code available for payment source | Admitting: Family

## 2018-03-26 ENCOUNTER — Encounter: Payer: Self-pay | Admitting: Family

## 2018-03-26 VITALS — BP 120/82 | HR 78 | Temp 98.6°F | Resp 16 | Ht 66.0 in | Wt 246.0 lb

## 2018-03-26 DIAGNOSIS — E039 Hypothyroidism, unspecified: Secondary | ICD-10-CM

## 2018-03-26 DIAGNOSIS — F418 Other specified anxiety disorders: Secondary | ICD-10-CM | POA: Diagnosis not present

## 2018-03-26 LAB — TSH: TSH: 3.32 u[IU]/mL (ref 0.35–4.50)

## 2018-03-26 MED ORDER — ESCITALOPRAM OXALATE 10 MG PO TABS: ORAL_TABLET | ORAL | 0 refills | Status: DC

## 2018-03-26 MED ORDER — LEVOTHYROXINE SODIUM 125 MCG PO TABS: 125.0000 ug | ORAL_TABLET | Freq: Every day | ORAL | 1 refills | Status: DC

## 2018-03-26 MED FILL — LEVOTHYROXINE 125 MCG TAB: 125 | 90 days supply | Qty: 90 | Fill #0

## 2018-03-26 NOTE — Patient Instructions (Signed)
Please complete lab work prior to leaving. Work on [employer], regular exercise and smaller portions. Continue to monitor your weight at home and call if you have any further weight gain.

## 2018-03-26 NOTE — Progress Notes (Signed)
Subjective:    Patient ID: Sheila Evans, female    DOB: 1975-03-15, 43 y.o.   MRN: [id number]  HPI   Patient is a 43 yr old female who presents today for follow up of her depression/anxiety. Last visit she noted worsening anxiety and tearfulness. We added lexapro 10mg  to her wellbutrin. She reports feeling "so much better" since we added lexapro.  Unfortunately she has gained 18 pounds in the last 3 months.   Wt Readings from Last 3 Encounters:  03/26/18 246 lb (111.6 kg)  12/09/17 228 lb (103.4 kg)  11/03/17 223 lb 9.6 oz (101.4 kg)   Reports that she eats out a lot.  Does not cook at home, eating a lot, of fried foods. Husband has gained weight too.  She planning to try to [employer] diet when her finances smooth out.    Review of Systems Past Medical History:  Diagnosis Date  . Abnormal Pap smear    leep, 3 normal since  . Gestational diabetes   . Headache(784.0)    teenager  . Hypertension   . Hypothyroid   . Infertility, female   . PP C/S 26w 5d 07/10/11 F (NICU) 07/11/2011     Social History   Socioeconomic History  . Marital status: Married    Spouse name: Not on file  . Number of children: Not on file  . Years of education: Not on file  . Highest education level: Not on file  Occupational History  . Not on file  Social Needs  . Financial resource strain: Not on file  . Food insecurity:    Worry: Not on file    Inability: Not on file  . Transportation needs:    Medical: Not on file    Non-medical: Not on file  Tobacco Use  . Smoking status: Current Every Day Smoker    Packs/day: 0.50    Types: Cigarettes  . Smokeless tobacco: Never Used  Substance and Sexual Activity  . Alcohol use: Yes    Comment: 1 drink monthly  . Drug use: No  . Sexual activity: Yes    Partners: Male    Birth control/protection: None    Comment: husband has zero sperm count  Lifestyle  . Physical activity:    Days per week: Not on file    Minutes per session: Not on file  .  Stress: Not on file  Relationships  . Social connections:    Talks on phone: Not on file    Gets together: Not on file    Attends religious service: Not on file    Active member of club or organization: Not on file    Attends meetings of clubs or organizations: Not on file    Relationship status: Not on file  . Intimate partner violence:    Fear of current or ex partner: Not on file    Emotionally abused: Not on file    Physically abused: Not on file    Forced sexual activity: Not on file  Other Topics Concern  . 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 CERVICAL CERCLAGE  04/18/2011   Procedure: CERCLAGE CERVICAL;  Surgeon: Alphonsus Sias. Ernestina Penna, MD;  Location: [facility];  Service: Gynecology;  Laterality: N/A;  . CERVICAL CERCLAGE  07/10/2011   Procedure: CERCLAGE CERVICAL;  Surgeon: Tresa Endo A. Ernestina Penna, MD;  Location: [facility];  Service: Gynecology;  Laterality: N/A;  removal  . CESAREAN SECTION  07/10/2011   Procedure: CESAREAN SECTION;  Surgeon: Tresa Endo A. Ernestina Penna, MD;  Location: [facility];  Service: Gynecology;  Laterality: N/A;  . LEEP    . LEEP    . TONSILLECTOMY      Family History  Problem Relation Age of Onset  . Diabetes Mother   . Hyperlipidemia Mother   . Heart attack Father   . Sudden death Father        61  . Anesthesia problems Neg Hx   . Hypertension Neg Hx   . Cancer Neg Hx     Allergies  Allergen Reactions  . Latex Rash    Current Outpatient Medications on File Prior to Visit  Medication Sig Dispense Refill  . buPROPion (WELLBUTRIN XL) 300 MG 24 hr tablet Take 1 tablet (300 mg total) by mouth daily. 90 tablet 1  . escitalopram (LEXAPRO) 10 MG tablet 1 tablet by mouth once  daily. 15 tablet 0  . levothyroxine (SYNTHROID, LEVOTHROID) 125 MCG tablet TAKE 1 TABLET (125 MCG TOTAL) BY MOUTH DAILY. 90 tablet 1   No current facility-administered medications on file prior to visit.     BP 120/82 (BP Location: Right Arm, Patient Position: Sitting, Cuff Size: Large)   Pulse 78   Temp 98.6 F (37 C) (Oral)   Resp 16   Ht 5\' 6"  (1.676 m)   Wt 246 lb (111.6 kg)   SpO2 98%   BMI 39.71 kg/m       Objective:   Physical Exam Constitutional:      Appearance: She is well-developed.  Neck:     Musculoskeletal: Neck supple.     Thyroid: No thyromegaly.  Cardiovascular:     Rate and Rhythm: Normal rate and regular rhythm.     Heart sounds: Normal heart sounds. No murmur.  Pulmonary:     Effort: Pulmonary effort is normal. No respiratory distress.     Breath sounds: Normal breath sounds. No wheezing.  Skin:    General: Skin is warm and dry.  Neurological:     Mental Status: She is alert and oriented to person, place, and time.  Psychiatric:        Behavior: Behavior normal.        Thought Content: Thought content normal.        Judgment: Judgment normal.           Assessment & Plan:  Depression/anxiety- we discussed that lexapro could be contributing to her weight gain. We discussed a 1 month weight recheck and then change of meds if further weight gain at that time. She states that her weight gain is secondary to poor choices, large portions and that she will work on this. Requests follow up in 3 months.  States "I would rather die of a heart attack than sadness."  Advised her as follows:    Work on [employer], regular exercise and smaller portions. Continue to monitor your weight at home and call if you have any further weight gain.   Hypothyroid- reports good compliance with synthroid. Will check TSH.

## 2018-03-29 MED ORDER — VORTIOXETINE HBR 20 MG PO TABS: ORAL_TABLET | ORAL | 0 refills | Status: DC

## 2018-03-29 MED FILL — TRINTELLIX 20 MG TABLET: 20 | 30 days supply | Qty: 30 | Fill #0

## 2018-03-29 NOTE — Addendum Note (Signed)
Addended by: Sandford Craze on: 03/29/2018 12:49 PM   Modules accepted: Orders

## 2018-04-13 ENCOUNTER — Encounter: Payer: Self-pay | Admitting: Family

## 2018-04-13 DIAGNOSIS — Z111 Encounter for screening for respiratory tuberculosis: Secondary | ICD-10-CM

## 2018-04-16 ENCOUNTER — Other Ambulatory Visit (INDEPENDENT_AMBULATORY_CARE_PROVIDER_SITE_OTHER): Payer: No Typology Code available for payment source

## 2018-04-16 DIAGNOSIS — Z111 Encounter for screening for respiratory tuberculosis: Secondary | ICD-10-CM

## 2018-04-17 LAB — QUANTIFERON-TB GOLD PLUS
Mitogen-NIL: 9.28 IU/mL
NIL: 0.03 IU/mL
QuantiFERON-TB Gold Plus: NEGATIVE
TB1-NIL: 0.02 IU/mL
TB2-NIL: 0.01 IU/mL

## 2018-04-29 ENCOUNTER — Encounter: Payer: Self-pay | Admitting: Family

## 2018-04-30 MED ORDER — VORTIOXETINE HBR 20 MG PO TABS: 20.0000 mg | ORAL_TABLET | Freq: Every day | ORAL | 0 refills | Status: DC

## 2018-04-30 MED FILL — TRINTELLIX 20 MG TABLET: 20 | 30 days supply | Qty: 30 | Fill #0

## 2018-05-17 ENCOUNTER — Ambulatory Visit: Payer: No Typology Code available for payment source | Admitting: Family

## 2018-05-27 ENCOUNTER — Encounter: Payer: Self-pay | Admitting: Family

## 2018-05-27 NOTE — Telephone Encounter (Signed)
Email updated in Epic.

## 2018-05-28 ENCOUNTER — Other Ambulatory Visit: Payer: Self-pay

## 2018-05-28 ENCOUNTER — Ambulatory Visit (INDEPENDENT_AMBULATORY_CARE_PROVIDER_SITE_OTHER): Payer: No Typology Code available for payment source | Admitting: Family

## 2018-05-28 DIAGNOSIS — R635 Abnormal weight gain: Secondary | ICD-10-CM | POA: Diagnosis not present

## 2018-05-28 DIAGNOSIS — F418 Other specified anxiety disorders: Secondary | ICD-10-CM

## 2018-05-28 MED ORDER — VORTIOXETINE HBR 20 MG PO TABS: 20.0000 mg | ORAL_TABLET | Freq: Every day | ORAL | 1 refills | Status: DC

## 2018-05-28 MED FILL — TRINTELLIX 20 MG TABLET: 20 | 90 days supply | Qty: 90 | Fill #0

## 2018-05-28 NOTE — Progress Notes (Signed)
Virtual Visit via Video Note  I connected with@ on 05/28/18 at  9:00 AM EDT by a video enabled telemedicine application and verified that I am speaking with the correct person using two identifiers.   I discussed the limitations of evaluation and management by telemedicine and the availability of in person appointments. The patient expressed understanding and agreed to proceed.  Only the patient and myself were on today's video visit. The patient was at home and I was in my office at the time of today's visit.   History of Present Illness:  Patient is a 43 year old female who presents today for follow-up of her depression.  Last visit she was noted to be stable on Lexapro and Wellbutrin.  Unfortunately she had had significant weight gain with the addition of Lexapro.  We discontinued Lexapro and placed the patient on Trintellix.  She reports tolerating Trintellix without difficulty however noted that she did have some itching which lasted approximately 1 month.  This itching has improved and she is states that the itching is tolerable.  In addition she denies any rash.  She reports that her mood remains stable.  Feels that it is working as well as the Lexapro.  Weight gain- she reports that she is picking up a new puppy tomorrow.  She is excited to become more active outside.  She has managed to lose a few pounds since her last visit.  Weight today is 242.   Wt Readings from Last 3 Encounters:  03/26/18 246 lb (111.6 kg)  12/09/17 228 lb (103.4 kg)  11/03/17 223 lb 9.6 oz (101.4 kg)      Observations/Objective:   Gen: Awake, alert, no acute distress Resp: Breathing is even and non-labored Psych: calm/pleasant demeanor Neuro: Alert and Oriented x 3, + facial symmetry, speech is clear.   Depression screen [facility] 2/9 05/28/2018 03/26/2018 11/03/2017 09/29/2016 02/23/2013  Decreased Interest - 0 0 0 0  Down, Depressed, Hopeless 0 0 0 1 0  PHQ - 2 Score 0 0 0 1 0  Altered sleeping 0 0 1 0 -   Tired, decreased energy 0 0 0 0 -  Change in appetite 1 0 1 0 -  Feeling bad or failure about yourself  0 0 0 0 -  Trouble concentrating 0 0 - 1 -  Moving slowly or fidgety/restless 0 0 0 0 -  Suicidal thoughts 0 0 0 0 -  PHQ-9 Score 1 0 2 2 -  Difficult doing work/chores Not difficult at all Not difficult at all - - -    Assessment and Plan:  Depression/anxiety- remains well controlled on current regimen.  I have advised the patient to let me know if she develops a rash or if her symptoms of itching worsen.  Continue current medications.  Weight gain- her weight has now stabilized and has gone down a few pounds.  I have advised her to work on increasing her activity and continue to work on weight loss.  Patient is advised to follow-up in 6 months, sooner if new or worsening symptoms.  follow Up Instructions:    I discussed the assessment and treatment plan with the patient. The patient was provided an opportunity to ask questions and all were answered. The patient agreed with the plan and demonstrated an understanding of the instructions.   The patient was advised to call back or seek an in-person evaluation if the symptoms worsen or if the condition fails to improve as anticipated.    Keia Rask S  Inda Castle, NP

## 2018-08-24 ENCOUNTER — Other Ambulatory Visit: Payer: Self-pay

## 2018-08-24 ENCOUNTER — Ambulatory Visit
Admission: RE | Admit: 2018-08-24 | Discharge: 2018-08-24 | Disposition: A | Discharge status: Home / Self Care | Payer: No Typology Code available for payment source | Source: Ambulatory Visit | Attending: Obstetrics | Admitting: Obstetrics

## 2018-08-24 ENCOUNTER — Ambulatory Visit
Admission: RE | Admit: 2018-08-24 | Discharge: 2018-08-24 | Disposition: A | Discharge status: Home / Self Care | Payer: 59 | Source: Ambulatory Visit | Attending: Obstetrics | Admitting: Obstetrics

## 2018-08-24 DIAGNOSIS — R922 Inconclusive mammogram: Secondary | ICD-10-CM

## 2018-09-27 ENCOUNTER — Other Ambulatory Visit: Payer: Self-pay

## 2018-09-28 ENCOUNTER — Encounter: Payer: Self-pay | Admitting: Family

## 2018-09-28 ENCOUNTER — Ambulatory Visit (HOSPITAL_BASED_OUTPATIENT_CLINIC_OR_DEPARTMENT_OTHER)
Admission: RE | Admit: 2018-09-28 | Discharge: 2018-09-28 | Disposition: A | Discharge status: Home / Self Care | Payer: No Typology Code available for payment source | Source: Ambulatory Visit | Attending: Family | Admitting: Family

## 2018-09-28 ENCOUNTER — Ambulatory Visit (INDEPENDENT_AMBULATORY_CARE_PROVIDER_SITE_OTHER): Payer: No Typology Code available for payment source | Admitting: Family

## 2018-09-28 ENCOUNTER — Other Ambulatory Visit: Payer: Self-pay

## 2018-09-28 VITALS — BP 124/82 | HR 81 | Temp 99.1°F | Resp 16 | Ht 66.0 in | Wt 261.0 lb

## 2018-09-28 DIAGNOSIS — R102 Pelvic and perineal pain: Secondary | ICD-10-CM

## 2018-09-28 LAB — POC URINALSYSI DIPSTICK (AUTOMATED)
Bilirubin, UA: NEGATIVE
Blood, UA: NEGATIVE
Glucose, UA: NEGATIVE
Ketones, UA: NEGATIVE
Leukocytes, UA: NEGATIVE
Nitrite, UA: NEGATIVE
Protein, UA: POSITIVE — AB
Spec Grav, UA: >=1.03 — AB (ref 1.010–1.025)
Urobilinogen, UA: NEGATIVE E.U./dL — AB
pH, UA: 5.5 (ref 5.0–8.0)

## 2018-09-28 LAB — POCT URINE PREGNANCY: Preg Test, Ur: NEGATIVE

## 2018-09-28 NOTE — Patient Instructions (Signed)
Please complete ultrasound for further evaluation.  Drink 32 oz of water prior to the ultrasound please.  Go to the ER if severe/worsening pain.

## 2018-09-29 ENCOUNTER — Encounter: Payer: Self-pay | Admitting: Family

## 2018-09-29 ENCOUNTER — Telehealth: Payer: Self-pay | Admitting: Family

## 2018-09-29 NOTE — Telephone Encounter (Signed)
Patient advised of results, she verbalized understanding and she will send a MyChart message to provider if any other questions or concerns.

## 2018-09-29 NOTE — Telephone Encounter (Signed)
Please contact pt and let her know that her ultrasound shows a 1.5 cm cyst on her left ovary. This is the most likely cause for her pain.  Please let me know if pain worsens or if it does not continue to improve in the next 2 weeks.

## 2018-09-30 NOTE — Progress Notes (Signed)
Subjective  Patient ID: Sheila Evans, female DOB: 16-Mar-1975, 43 y.o. MRN: [id number]  HPI  Patient is a 43 yr old female who presents today with chief complaint of lower abdominal pain. Pain is located in the LLQ. Reports that it "comes and goes." Reports that pain has been present for several weeks and is constant. "every movement hurts." Reports that it hurts if she coughs or sneezes. Has to hold her belly in when she coughs. Has a pulling sensation behind her umbilicus.  Normal bowel movements. No other nausea/vomiting/diarrhea.  Review of Systems  see HPI      Past Medical History:  Diagnosis Date  . Abnormal Pap smear    leep, 3 normal since  . Gestational diabetes   . Headache(784.0)    teenager  . Hypertension   . Hypothyroid   . Infertility, female   . PP C/S 26w 5d 07/10/11 F (NICU) 07/11/2011   Social History        Socioeconomic History  . Marital status: Married    Spouse name: Not on file  . Number of children: Not on file  . Years of education: Not on file  . Highest education level: Not on file  Occupational History  . Not on file  Social Needs  . Financial resource strain: Not on file  . Food insecurity    Worry: Not on file    Inability: Not on file  . Transportation needs    Medical: Not on file    Non-medical: Not on file  Tobacco Use  . Smoking status: Former Smoker    Packs/day: 0.50    Types: Cigarettes    Quit date: 2019    Years since quitting: 1.5  . Smokeless tobacco: Never Used  Substance and Sexual Activity  . Alcohol use: Yes    Comment: 1 drink monthly  . Drug use: No  . Sexual activity: Yes    Partners: Male    Birth control/protection: None    Comment: husband has zero sperm count  Lifestyle  . Physical activity    Days per week: Not on file    Minutes per session: Not on file  . Stress: Not on file  Relationships  . Social Herbalist on phone: Not on file    Gets together: Not on file    Attends religious  service: Not on file    Active member of club or organization: Not on file    Attends meetings of clubs or organizations: Not on file    Relationship status: Not on file  . Intimate partner violence    Fear of current or ex partner: Not on file    Emotionally abused: Not on file    Physically abused: Not on file    Forced sexual activity: Not on file  Other Topics Concern  . 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 CERVICAL CERCLAGE  04/18/2011   Procedure: CERCLAGE CERVICAL; Surgeon: Claiborne Billings A. Pamala Hurry, MD; Location: [facility]; Service: Gynecology; Laterality: N/A;  . CERVICAL CERCLAGE  07/10/2011   Procedure: CERCLAGE CERVICAL; Surgeon: Claiborne Billings A. Pamala Hurry, MD; Location:  [facility]; Service: Gynecology; Laterality: N/A; removal  . CESAREAN SECTION  07/10/2011   Procedure: CESAREAN SECTION; Surgeon: Tresa EndoKelly A. Ernestina PennaFogleman, MD; Location: [facility]; Service: Gynecology; Laterality: N/A;  . LEEP    . LEEP    . TONSILLECTOMY          Family History  Problem Relation Age of Onset  . Diabetes Mother   . Hyperlipidemia Mother   . Heart attack Father   . Sudden death Father    3955  . Anesthesia problems Neg Hx   . Hypertension Neg Hx   . Cancer Neg Hx        Allergies  Allergen Reactions  . Latex Rash         Current Outpatient Medications on File Prior to Visit  Medication Sig Dispense Refill  . levothyroxine (SYNTHROID, LEVOTHROID) 125 MCG tablet Take 1 tablet (125 mcg total) by mouth daily. 90 tablet 1   No current facility-administered medications on file prior to visit.   BP 124/82 (BP Location: Right Arm, Patient Position: Sitting, Cuff Size: Large)  Pulse 81  Temp 99.1 F (37.3 C) (Oral)  Resp  16  Ht 5\' 6"  (1.676 m)  Wt 261 lb (118.4 kg)  SpO2 99%  BMI 42.13 kg/m  Objective:  Objective  Physical Exam  Constitutional:  Appearance: She is well-developed.  Neck:  Musculoskeletal: Neck supple.  Thyroid: No thyromegaly.  Cardiovascular:  Rate and Rhythm: Normal rate and regular rhythm.  Heart sounds: Normal heart sounds. No murmur.  Pulmonary:  Effort: Pulmonary effort is normal. No respiratory distress.  Breath sounds: Normal breath sounds. No wheezing.  Abdominal:  General: Bowel sounds are normal.  Tenderness: There is abdominal tenderness in the left lower quadrant.  Skin:  General: Skin is warm and dry.  Neurological:  Mental Status: She is alert and oriented to person, place, and time.  Psychiatric:  Behavior: Behavior normal.  Thought Content: Thought content normal.  Judgment: Judgment normal.   Assessment & Plan:    LLQ tenderness- High suspicion for ovarian cyst. Will send for Pelvic/transvaginal US for further evaluation.

## 2018-10-12 ENCOUNTER — Encounter: Payer: Self-pay | Admitting: Family

## 2018-10-12 MED ORDER — MELOXICAM 7.5 MG PO TABS: 7.5000 mg | ORAL_TABLET | Freq: Every day | ORAL | 0 refills | Status: DC

## 2018-10-12 MED FILL — MELOXICAM 7.5 MG TABLET: 7.5 | 14 days supply | Qty: 14 | Fill #0

## 2018-10-12 NOTE — Addendum Note (Signed)
Addended by: Debbrah Alar on: 10/12/2018 01:39 PM   Modules accepted: Orders

## 2018-11-04 ENCOUNTER — Encounter: Payer: Self-pay | Admitting: Family

## 2018-11-04 MED ORDER — SUMATRIPTAN SUCCINATE 50 MG PO TABS: 50.0000 mg | ORAL_TABLET | ORAL | 2 refills | Status: DC | PRN

## 2018-11-04 MED FILL — SUMATRIPTAN SUCC 50 MG TAB: 50 | 20 days supply | Qty: 10 | Fill #0

## 2018-12-06 ENCOUNTER — Encounter: Payer: Self-pay | Admitting: Family

## 2018-12-06 MED ORDER — FLUCONAZOLE 150 MG PO TABS: ORAL_TABLET | ORAL | 0 refills | Status: DC

## 2018-12-06 MED FILL — FLUCONAZOLE 150 MG TABS: 150 | 6 days supply | Qty: 2 | Fill #0

## 2018-12-22 ENCOUNTER — Encounter: Payer: Self-pay | Admitting: Family

## 2019-02-28 IMAGING — MG DIGITAL DIAGNOSTIC UNILATERAL LEFT MAMMOGRAM WITH TOMO AND CAD
2 series · 2 of 10 positions shown · non-contrast
Comparison: Previous exam(s).

CLINICAL DATA: Left central breast focal asymmetry seen on most
recent screening mammography.

EXAM:
DIGITAL DIAGNOSTIC LEFT MAMMOGRAM WITH CAD AND TOMO
ULTRASOUND LEFT BREAST

[L MLO tomo · tomo slice 41/81.0]
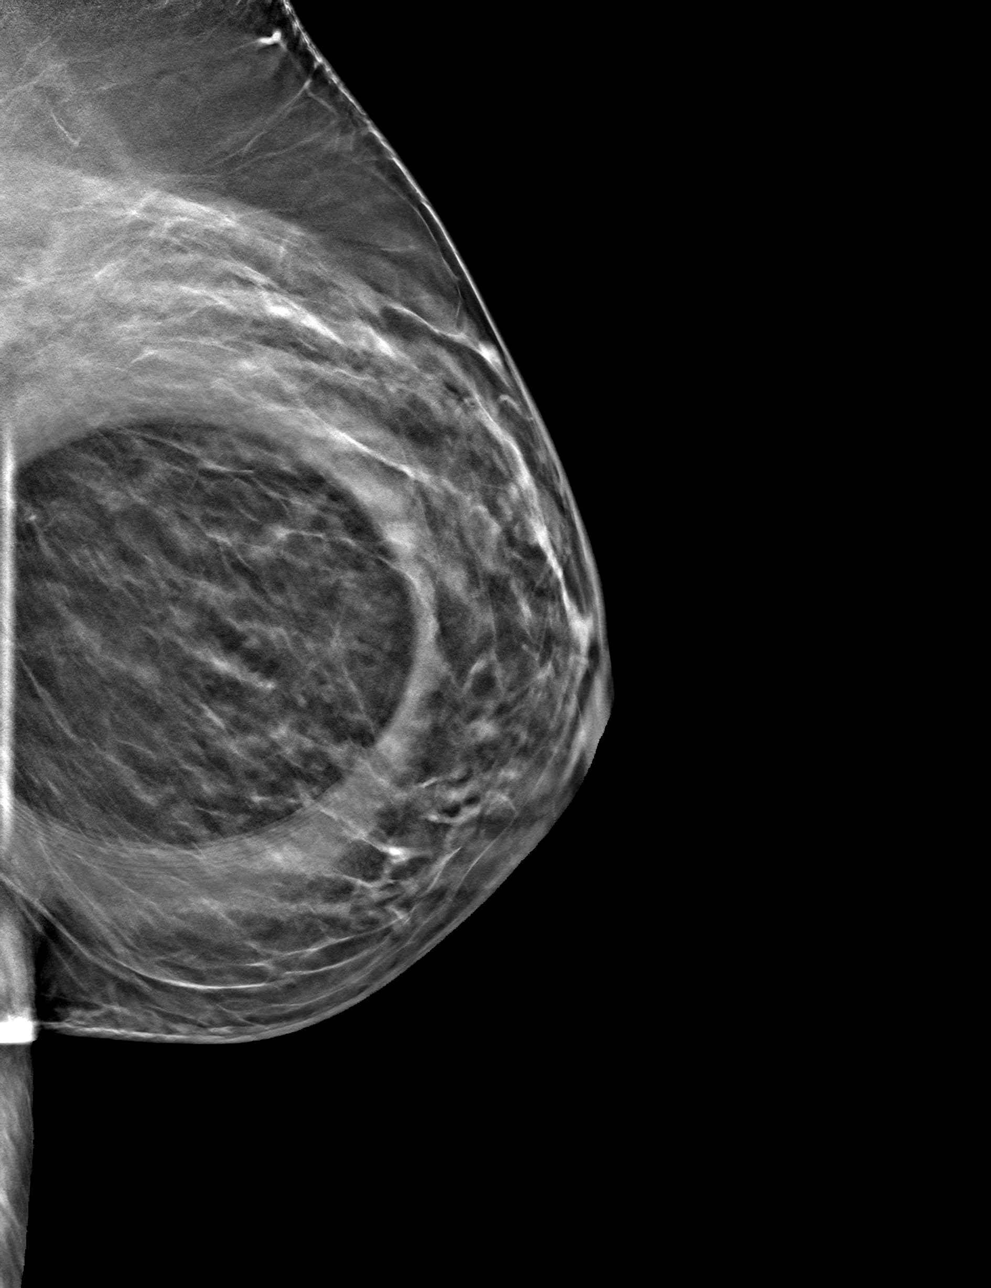

[L CC tomo · tomo slice 33/65.0]
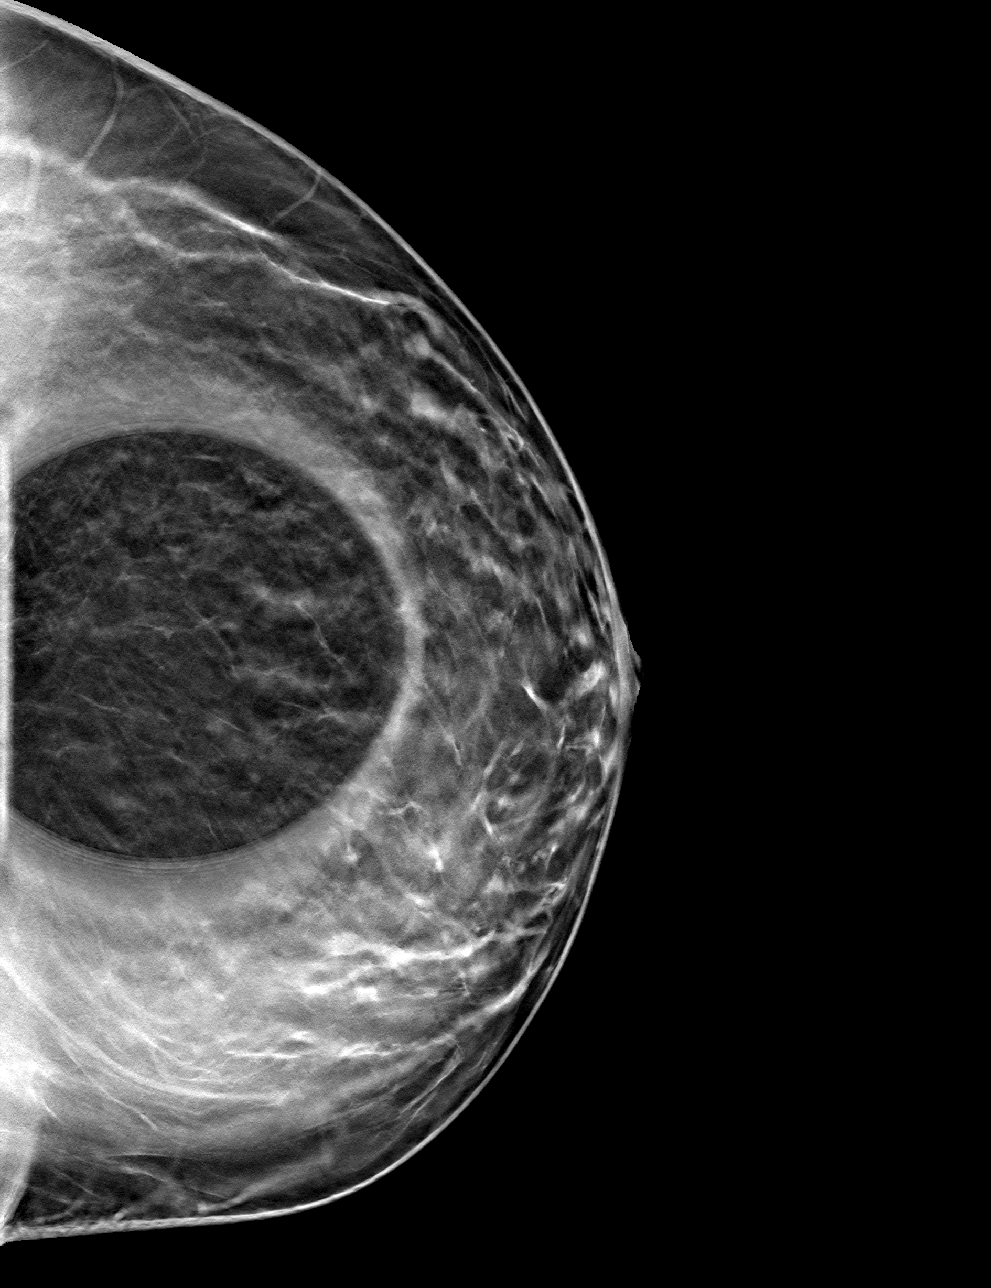

[2 of 10 positions shown; findings below may reference images not displayed]

ACR Breast Density Category b: There are scattered areas of
fibroglandular density.
FINDINGS: Additional mammographic views of the left breast demonstrate
persistent circumscribed low-density benign-appearing 5 mm nodule in
the slightly outer left breast, seen on the craniocaudal view.
According to 3D images it is located inferiorly.

Mammographic images were processed with CAD.

On physical exam, no suspicious masses are palpated.

Targeted ultrasound is performed, showing left breast 5 o'clock 1 cm
from the nipple hypoechoic circumscribed horizontally oriented
nodule which measures 3 x 3 x 6 mm. This probably benign finding
corresponds to the mammographically seen nodule.
IMPRESSION: Left breast 5 o'clock probably benign nodule, seen on baseline
screening mammogram, for which short-term follow-up is recommended.

RECOMMENDATION:
Diagnostic mammogram and possibly ultrasound of the left breast in 6
months. (Code:SG-G-AOG)

I have discussed the findings and recommendations with the patient.
Results were also provided in writing at the conclusion of the
visit. If applicable, a reminder letter will be sent to the patient
regarding the next appointment.

BI-RADS CATEGORY  3: Probably benign.

## 2019-02-28 IMAGING — US ULTRASOUND LEFT BREAST LIMITED
1 series · 6 of 6 positions shown · non-contrast
Comparison: Previous exam(s).

CLINICAL DATA: Left central breast focal asymmetry seen on most
recent screening mammography.

EXAM:
DIGITAL DIAGNOSTIC LEFT MAMMOGRAM WITH CAD AND TOMO
ULTRASOUND LEFT BREAST

[Series 1: ultrasound left breast limited · 0.07mm/px · 6 of 6 slices shown]
[im 1/6]
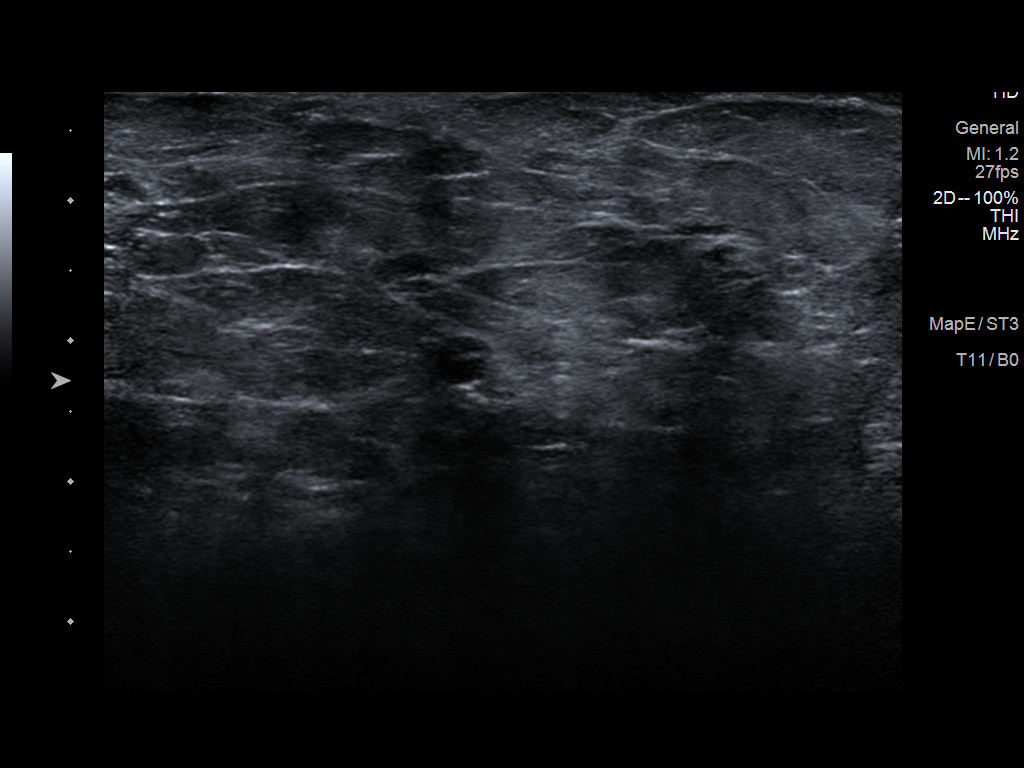
[im 2/6]
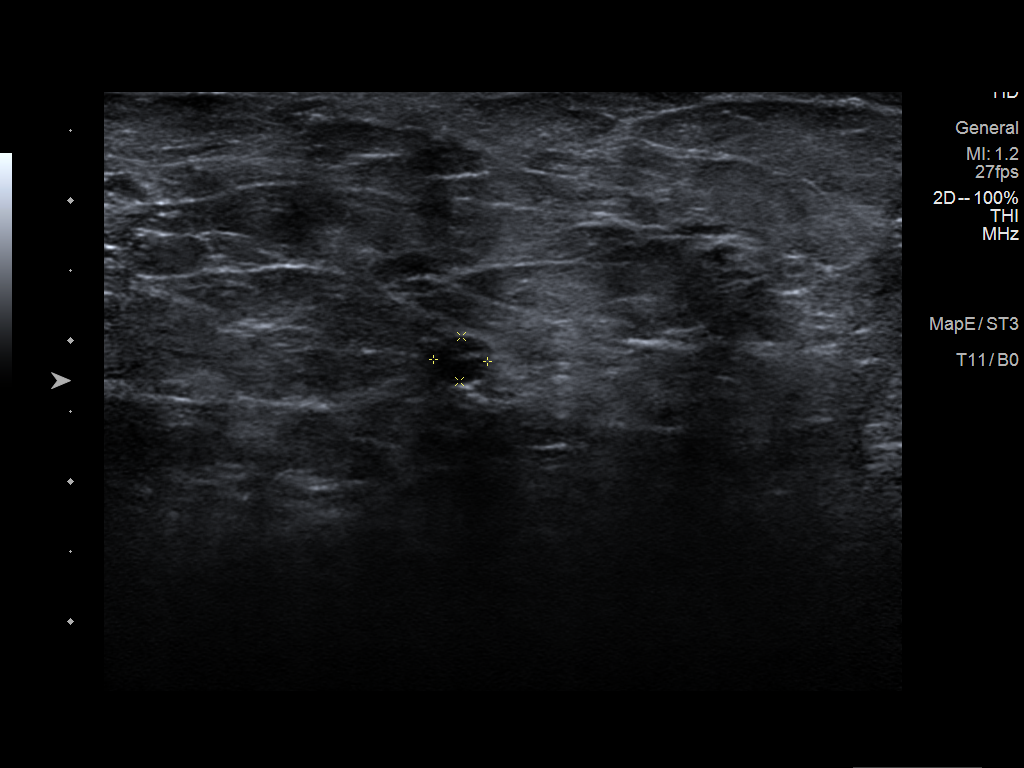
[im 3/6]
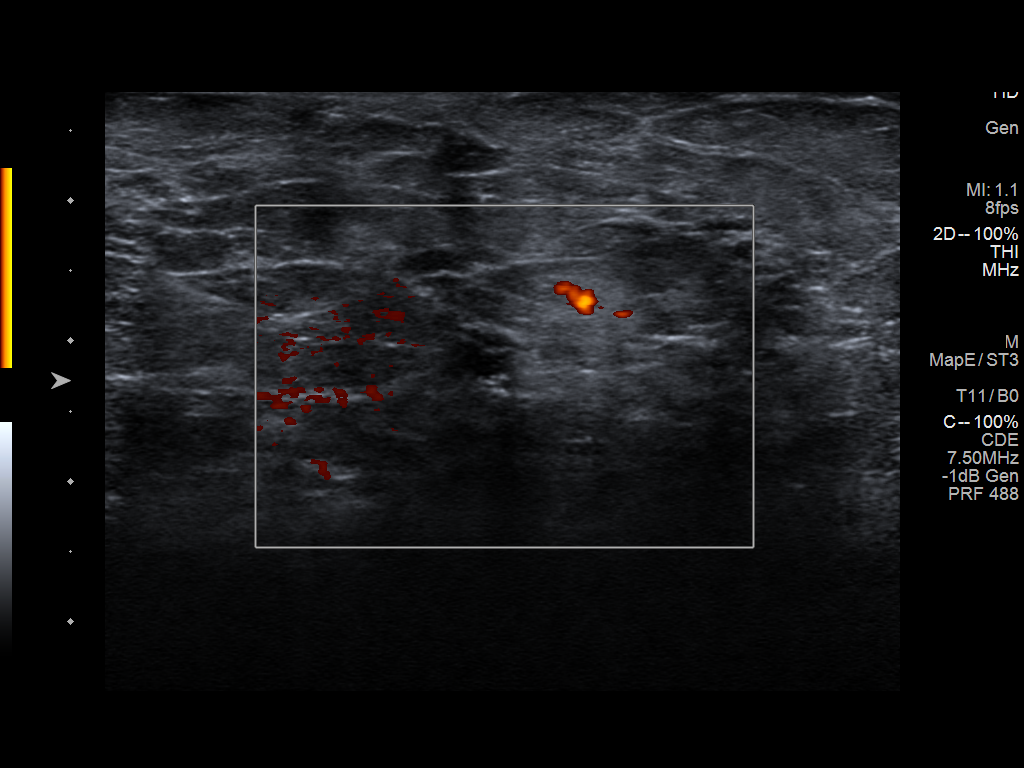
[im 4/6]
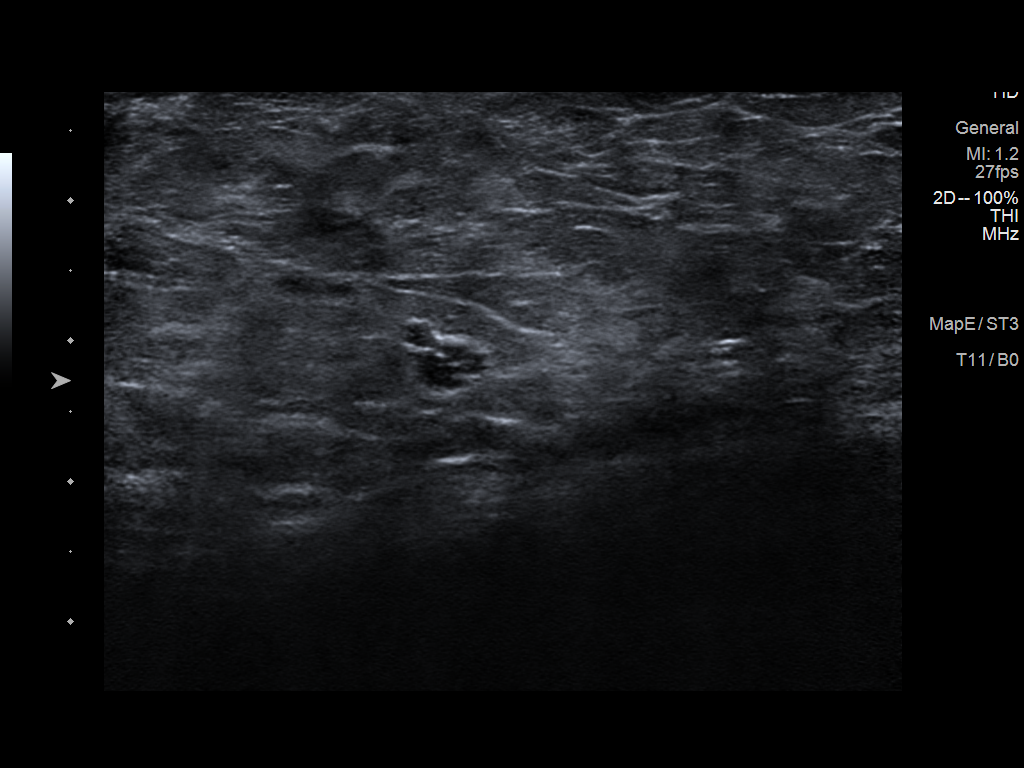
[im 5/6]
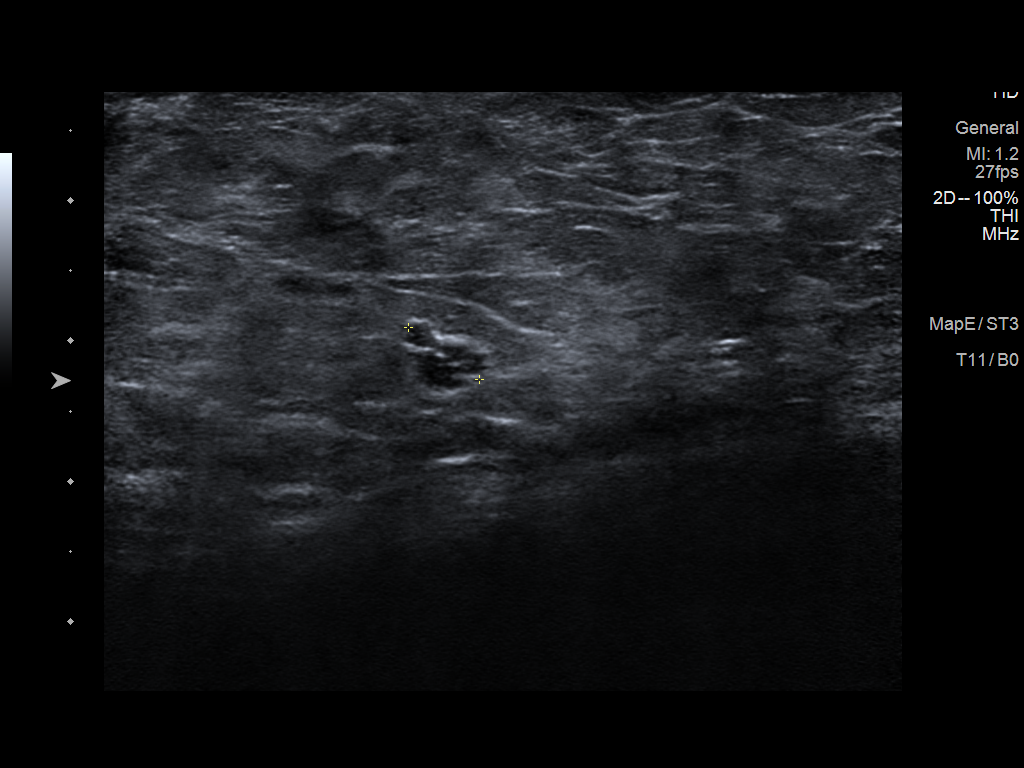
[im 6/6]
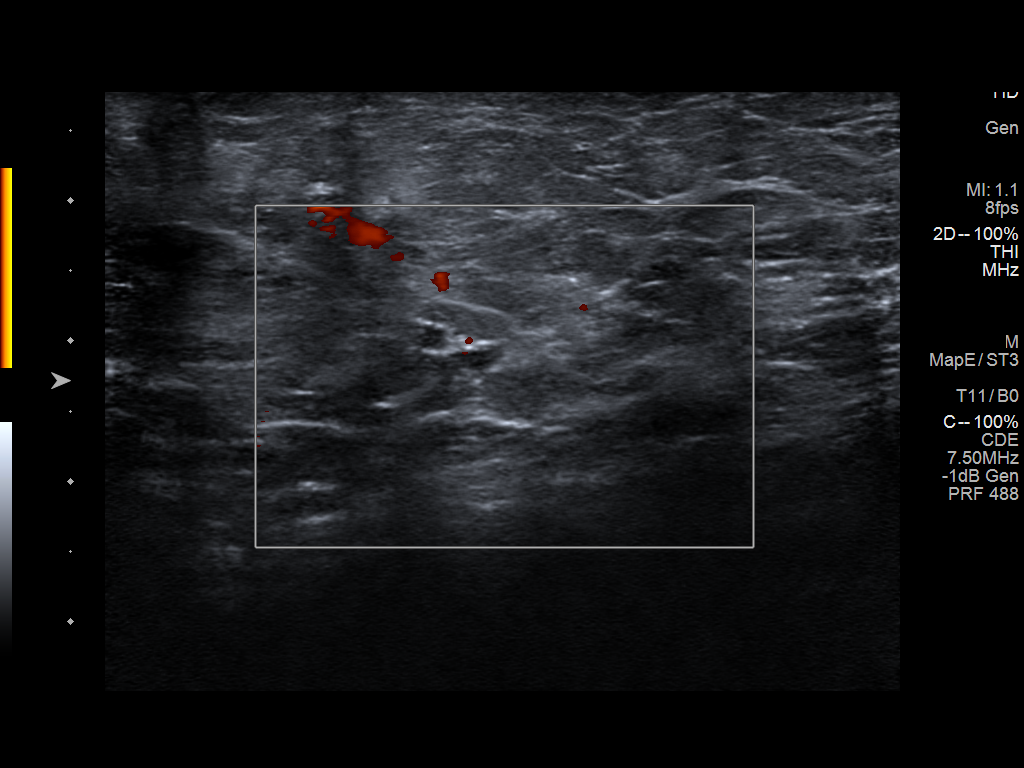

[6 of 6 positions shown; findings below may reference images not displayed]

ACR Breast Density Category b: There are scattered areas of
fibroglandular density.
FINDINGS: Additional mammographic views of the left breast demonstrate
persistent circumscribed low-density benign-appearing 5 mm nodule in
the slightly outer left breast, seen on the craniocaudal view.
According to 3D images it is located inferiorly.

Mammographic images were processed with CAD.

On physical exam, no suspicious masses are palpated.

Targeted ultrasound is performed, showing left breast 5 o'clock 1 cm
from the nipple hypoechoic circumscribed horizontally oriented
nodule which measures 3 x 3 x 6 mm. This probably benign finding
corresponds to the mammographically seen nodule.
IMPRESSION: Left breast 5 o'clock probably benign nodule, seen on baseline
screening mammogram, for which short-term follow-up is recommended.

RECOMMENDATION:
Diagnostic mammogram and possibly ultrasound of the left breast in 6
months. (Code:SG-G-AOG)

I have discussed the findings and recommendations with the patient.
Results were also provided in writing at the conclusion of the
visit. If applicable, a reminder letter will be sent to the patient
regarding the next appointment.

BI-RADS CATEGORY  3: Probably benign.

## 2019-04-04 ENCOUNTER — Other Ambulatory Visit: Payer: Self-pay | Admitting: Family

## 2019-04-04 MED FILL — SUMATRIPTAN SUCC 50 MG TAB: 50 | 30 days supply | Qty: 18 | Fill #1

## 2019-04-06 MED FILL — LEVOTHYROXINE 125 MCG TAB: 125 | 30 days supply | Qty: 30 | Fill #0

## 2019-04-06 NOTE — Telephone Encounter (Signed)
30 day rx of synthroid rx sent. She is overdue for follow up. Please contact pt to schedule follow up.

## 2019-04-06 NOTE — Telephone Encounter (Signed)
Called pt left msg. Will try to call again

## 2020-01-02 ENCOUNTER — Other Ambulatory Visit: Payer: Self-pay | Admitting: Family

## 2020-01-02 ENCOUNTER — Encounter: Payer: Self-pay | Admitting: Family

## 2020-01-02 DIAGNOSIS — Z8639 Personal history of other endocrine, nutritional and metabolic disease: Secondary | ICD-10-CM

## 2020-01-02 MED ORDER — FLUCONAZOLE 150 MG PO TABS: ORAL_TABLET | ORAL | 0 refills | Status: DC

## 2020-01-02 MED FILL — FLUCONAZOLE 150 MG TABS: 150 | 3 days supply | Qty: 2 | Fill #0

## 2020-01-09 ENCOUNTER — Other Ambulatory Visit: Payer: Self-pay

## 2020-01-09 ENCOUNTER — Other Ambulatory Visit: Payer: Self-pay | Admitting: Family

## 2020-01-09 ENCOUNTER — Ambulatory Visit (INDEPENDENT_AMBULATORY_CARE_PROVIDER_SITE_OTHER): Payer: 59 | Admitting: Family

## 2020-01-09 ENCOUNTER — Encounter: Payer: Self-pay | Admitting: Family

## 2020-01-09 VITALS — BP 148/73 | HR 88 | Temp 98.8°F | Resp 16 | Ht 66.0 in | Wt 274.0 lb

## 2020-01-09 DIAGNOSIS — E039 Hypothyroidism, unspecified: Secondary | ICD-10-CM | POA: Diagnosis not present

## 2020-01-09 DIAGNOSIS — G43909 Migraine, unspecified, not intractable, without status migrainosus: Secondary | ICD-10-CM | POA: Diagnosis not present

## 2020-01-09 DIAGNOSIS — Z23 Encounter for immunization: Secondary | ICD-10-CM | POA: Diagnosis not present

## 2020-01-09 DIAGNOSIS — Z0000 Encounter for general adult medical examination without abnormal findings: Secondary | ICD-10-CM

## 2020-01-09 DIAGNOSIS — F418 Other specified anxiety disorders: Secondary | ICD-10-CM | POA: Diagnosis not present

## 2020-01-09 LAB — TSH: TSH: 17.68 u[IU]/mL — ABNORMAL HIGH (ref 0.35–4.50)

## 2020-01-09 MED ORDER — LEVOTHYROXINE SODIUM 125 MCG PO TABS: 125.0000 ug | ORAL_TABLET | Freq: Every day | ORAL | 1 refills | Status: DC

## 2020-01-09 MED FILL — LEVOTHYROXINE SODIUM 125 MC: 125 | 30 days supply | Qty: 30 | Fill #0

## 2020-01-09 NOTE — Patient Instructions (Signed)
Please complete lab work prior to leaving.   

## 2020-01-09 NOTE — Progress Notes (Signed)
Subjective:    Patient ID: Sheila Evans, female    DOB: 17-Jul-1975, 44 y.o.   MRN: [id number]  HPI  Patient is a 43 yr old female who presents today for follow up.   She is interested in bariatric surgery- gastric sleeve.  Request referral.  Last migraine was 3 months ago.  Uses imitrex prn with good relief.   Notes situational anxiety. Denies any current depression symptoms.  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   Hypothyroid- ran out of synthroid months ago. She has had some weight gain and fatigue.  Wt Readings from Last 3 Encounters:  01/09/20 274 lb (124.3 kg)  09/28/18 261 lb (118.4 kg)  03/26/18 246 lb (111.6 kg)     Review of Systems    see HPI  Past Medical History:  Diagnosis Date   Abnormal Pap smear    leep, 3 normal since   Gestational diabetes    Headache(784.0)    teenager   Hypertension    Hypothyroid    Infertility, female    PP C/S 26w 5d 07/10/11 F (NICU) 07/11/2011     Social History   Socioeconomic History   Marital status: Married    Spouse name: Not on file   Number of children: Not on file   Years of education: Not on file   Highest education level: Not on file  Occupational History   Not on file  Tobacco Use   Smoking status: Former Smoker    Packs/day: 0.50    Types: Cigarettes    Quit date: 2019    Years since quitting: 2.8   Smokeless tobacco: Never Used  Substance and Sexual Activity   Alcohol use: Yes    Comment: 1 drink monthly   Drug use: No   Sexual activity: Yes    Partners: Male    Birth control/protection: None    Comment: husband has zero sperm count  Other Topics Concern   Not on file  Social History Narrative   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   Has one daughter born 2013   Enjoys reading, Archivist, playing with her daughter   No pets   Family lives locally- mother helps with her daughter   Social Determinants of Manufacturing engineer Strain:    Difficulty of Paying Living Expenses: Not on file  Food Insecurity:    Worried About Programme researcher, broadcasting/film/video in the Last Year: Not on file   [employer] of Food in the Last Year: Not on file  Transportation Needs:    Lack of Transportation (Medical): Not on file   Lack of Transportation (Non-Medical): Not on file  Physical Activity:    Days of Exercise per Week: Not on file   Minutes of Exercise per Session: Not on file  Stress:    Feeling of Stress : Not on file  Social Connections:    Frequency of Communication with Friends and Family: Not on file   Frequency of Social Gatherings with Friends and Family: Not on file   Attends Religious Services: Not on file   Active Member of Clubs or Organizations: Not on file   Attends Banker Meetings: Not on file   Marital Status: Not on file  Intimate Partner Violence:    Fear of Current or Ex-Partner: Not on file   Emotionally Abused: Not on file   Physically Abused: Not on file  Sexually Abused: Not on file    Past Surgical History:  Procedure Laterality Date   CERVICAL CERCLAGE  04/18/2011   Procedure: CERCLAGE CERVICAL;  Surgeon: Tresa Endo A. Ernestina Penna, MD;  Location: [facility];  Service: Gynecology;  Laterality: N/A;   CERVICAL CERCLAGE  07/10/2011   Procedure: CERCLAGE CERVICAL;  Surgeon: Tresa Endo A. Ernestina Penna, MD;  Location: [facility];  Service: Gynecology;  Laterality: N/A;  removal   CESAREAN SECTION  07/10/2011   Procedure: CESAREAN SECTION;  Surgeon: Tresa Endo A. Ernestina Penna, MD;  Location: [facility];  Service: Gynecology;  Laterality: N/A;   LEEP     LEEP     TONSILLECTOMY      Family History  Problem Relation Age of Onset   Diabetes Mother    Hyperlipidemia Mother    Heart attack Father    Sudden death Father        2   Anesthesia problems Neg Hx    Hypertension Neg Hx    Cancer Neg Hx     Allergies  Allergen Reactions   Latex Rash    Current Outpatient  Medications on File Prior to Visit  Medication Sig Dispense Refill   meloxicam (MOBIC) 7.5 MG tablet Take 1 tablet (7.5 mg total) by mouth daily. 14 tablet 0   SUMAtriptan (IMITREX) 50 MG tablet Take 1 tablet (50 mg total) by mouth every 2 (two) hours as needed for migraine. May repeat in 2 hours if headache persists or recurs. 10 tablet 2   No current facility-administered medications on file prior to visit.    BP (!) 148/73 (BP Location: Right Arm, Patient Position: Sitting, Cuff Size: Large)    Pulse 88    Temp 98.8 F (37.1 C) (Oral)    Resp 16    Ht 5\' 6"  (1.676 m)    Wt 274 lb (124.3 kg)    SpO2 98%    BMI 44.22 kg/m    Objective:   Physical Exam Constitutional:      Appearance: She is well-developed.  Cardiovascular:     Rate and Rhythm: Normal rate and regular rhythm.     Heart sounds: Normal heart sounds. No murmur heard.   Pulmonary:     Effort: Pulmonary effort is normal. No respiratory distress.     Breath sounds: Normal breath sounds. No wheezing.  Psychiatric:        Behavior: Behavior normal.        Thought Content: Thought content normal.        Judgment: Judgment normal.           Assessment & Plan:  Morbid obesity- refer to bariatrics at pt request.  Depression/anxiety- stable without medication. She notes some situational stressors.    Migraines- well controlled with prn use of imitrex.  Flu shot today.  This visit occurred during the SARS-CoV-2 public health emergency.  Safety protocols were in place, including screening questions prior to the visit, additional usage of staff PPE, and extensive cleaning of exam room while observing appropriate contact time as indicated for disinfecting solutions.

## 2020-02-01 ENCOUNTER — Other Ambulatory Visit: Payer: Self-pay | Admitting: Family

## 2020-02-01 ENCOUNTER — Encounter: Payer: Self-pay | Admitting: Family

## 2020-02-01 MED ORDER — FLUCONAZOLE 150 MG PO TABS: ORAL_TABLET | ORAL | 0 refills | Status: DC

## 2020-02-01 MED FILL — FLUCONAZOLE 150 MG TABS: 150 | 3 days supply | Qty: 2 | Fill #0

## 2020-02-03 ENCOUNTER — Other Ambulatory Visit: Payer: Self-pay | Admitting: Family

## 2020-02-03 ENCOUNTER — Telehealth: Payer: Self-pay | Admitting: Family

## 2020-02-03 MED ORDER — METRONIDAZOLE 0.75 % VA GEL: 1.0000 | Freq: Every day | VAGINAL | 0 refills | Status: DC

## 2020-02-03 MED FILL — metroNIDAZOLE 0.75 % GEL: 0.75 | 5 days supply | Qty: 70 | Fill #0

## 2020-02-03 NOTE — Telephone Encounter (Signed)
See mychart.  

## 2020-02-20 ENCOUNTER — Other Ambulatory Visit: Payer: Self-pay | Admitting: Family

## 2020-02-20 ENCOUNTER — Telehealth: Payer: Self-pay | Admitting: Family

## 2020-02-20 MED ORDER — FLUCONAZOLE 150 MG PO TABS: ORAL_TABLET | ORAL | 0 refills | Status: DC

## 2020-02-20 MED FILL — FLUCONAZOLE 150 MG TABS: 150 | 4 days supply | Qty: 2 | Fill #0

## 2020-02-20 NOTE — Telephone Encounter (Signed)
See mychart.  

## 2020-03-02 ENCOUNTER — Ambulatory Visit: Payer: 59

## 2020-06-29 ENCOUNTER — Ambulatory Visit (INDEPENDENT_AMBULATORY_CARE_PROVIDER_SITE_OTHER): Payer: 59 | Admitting: Family

## 2020-06-29 ENCOUNTER — Encounter: Payer: Self-pay | Admitting: Family

## 2020-06-29 ENCOUNTER — Other Ambulatory Visit (HOSPITAL_BASED_OUTPATIENT_CLINIC_OR_DEPARTMENT_OTHER): Payer: Self-pay

## 2020-06-29 ENCOUNTER — Other Ambulatory Visit: Payer: Self-pay

## 2020-06-29 ENCOUNTER — Other Ambulatory Visit (HOSPITAL_COMMUNITY)
Admission: RE | Admit: 2020-06-29 | Discharge: 2020-06-29 | Disposition: A | Discharge status: Home / Self Care | Payer: 59 | Source: Ambulatory Visit | Attending: Family | Admitting: Family

## 2020-06-29 VITALS — BP 142/87 | HR 88 | Temp 98.7°F | Resp 16 | Ht 66.0 in | Wt 255.0 lb

## 2020-06-29 DIAGNOSIS — Z01419 Encounter for gynecological examination (general) (routine) without abnormal findings: Secondary | ICD-10-CM | POA: Insufficient documentation

## 2020-06-29 DIAGNOSIS — M25561 Pain in right knee: Secondary | ICD-10-CM

## 2020-06-29 DIAGNOSIS — N760 Acute vaginitis: Secondary | ICD-10-CM | POA: Insufficient documentation

## 2020-06-29 DIAGNOSIS — Z0001 Encounter for general adult medical examination with abnormal findings: Secondary | ICD-10-CM | POA: Diagnosis not present

## 2020-06-29 DIAGNOSIS — Z0000 Encounter for general adult medical examination without abnormal findings: Secondary | ICD-10-CM | POA: Insufficient documentation

## 2020-06-29 DIAGNOSIS — E785 Hyperlipidemia, unspecified: Secondary | ICD-10-CM

## 2020-06-29 DIAGNOSIS — B373 Candidiasis of vulva and vagina: Secondary | ICD-10-CM | POA: Insufficient documentation

## 2020-06-29 DIAGNOSIS — A630 Anogenital (venereal) warts: Secondary | ICD-10-CM

## 2020-06-29 DIAGNOSIS — E039 Hypothyroidism, unspecified: Secondary | ICD-10-CM

## 2020-06-29 DIAGNOSIS — Z8639 Personal history of other endocrine, nutritional and metabolic disease: Secondary | ICD-10-CM

## 2020-06-29 DIAGNOSIS — F419 Anxiety disorder, unspecified: Secondary | ICD-10-CM | POA: Insufficient documentation

## 2020-06-29 DIAGNOSIS — B3731 Acute candidiasis of vulva and vagina: Secondary | ICD-10-CM | POA: Insufficient documentation

## 2020-06-29 LAB — LIPID PANEL
Cholesterol: 220 mg/dL — ABNORMAL HIGH (ref 0–200)
HDL: 37 mg/dL — ABNORMAL LOW (ref >39.00)
NonHDL: 183.21
Total CHOL/HDL Ratio: 6
Triglycerides: 298 mg/dL — ABNORMAL HIGH (ref 0.0–149.0)
VLDL: 59.6 mg/dL — ABNORMAL HIGH (ref 0.0–40.0)

## 2020-06-29 LAB — COMPREHENSIVE METABOLIC PANEL
ALT: 122 U/L — ABNORMAL HIGH (ref 0–35)
AST: 112 U/L — ABNORMAL HIGH (ref 0–37)
Albumin: 4.4 g/dL (ref 3.5–5.2)
Alkaline Phosphatase: 64 U/L (ref 39–117)
BUN: 11 mg/dL (ref 6–23)
CO2: 25 mEq/L (ref 19–32)
Calcium: 9.8 mg/dL (ref 8.4–10.5)
Chloride: 99 mEq/L (ref 96–112)
Creatinine, Ser: 0.76 mg/dL (ref 0.40–1.20)
GFR: 96.57 mL/min (ref >60.00)
Glucose, Bld: 243 mg/dL — ABNORMAL HIGH (ref 70–99)
Potassium: 4 mEq/L (ref 3.5–5.1)
Sodium: 135 mEq/L (ref 135–145)
Total Bilirubin: 0.7 mg/dL (ref 0.2–1.2)
Total Protein: 8.2 g/dL (ref 6.0–8.3)

## 2020-06-29 LAB — TSH: TSH: 19.61 u[IU]/mL — ABNORMAL HIGH (ref 0.35–4.50)

## 2020-06-29 LAB — LDL CHOLESTEROL, DIRECT: Direct LDL: 161 mg/dL

## 2020-06-29 MED ORDER — SERTRALINE HCL 50 MG PO TABS
ORAL_TABLET | ORAL | 0 refills | Status: DC
  Filled 2020-06-29: qty 30, 37d supply, fill #0

## 2020-06-29 MED ORDER — FLUCONAZOLE 150 MG PO TABS
ORAL_TABLET | ORAL | 0 refills | Status: DC
  Filled 2020-06-29: qty 3, 21d supply, fill #0

## 2020-06-29 MED FILL — Levothyroxine Sodium Tab 125 MCG: ORAL | 90 days supply | Qty: 90 | Fill #0 | Status: AC

## 2020-06-29 NOTE — Progress Notes (Signed)
Subjective:   By signing my name below, I, [name], attest that this documentation has been prepared under the direction and in the presence of Sandford Craze NP. 06/29/2020      Patient ID: Sheila Evans, female    DOB: 04-10-1975, 45 y.o.   MRN: [id number]  No chief complaint on file.   HPI   Patient is in today for a comprehensive physical exam. She is complaining about locking in her right knee. She notes that after it locks it soon pops and her mobility returns. 2 nights ago her knee locked for an extended period of time causing her pain.  She denies any cough, cold symptoms, headaches at this time. 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 She has not had surgery in the past year. She has had no change in her family history this year. Her paternal grandmother passed away from a stroke, her paternal grandfather passed away from a heart attack. Her maternal grandmother passed away from diabetes and stroke. She does not drink alcohol, use drugs, or smoke cigarettes.   She also complains of recurrent vaginal yeast infections. Feels like she has one now.  Has burning when she applies vagisil.  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 Viviann Spare for this.  Anxiety- She complains of having anxiety in her daily life. She notes that it prevents her from leaving the house. She is willing to start medication to manage it. Reports that her mind is always "going to the worse case scenario."   Immunizations: She has had the Covid 19 vaccination but no the booster vaccines. She is not willing to get the booster vaccinations at this time. She is UTD on her tetanus vaccinations. She is UTD on her flu vaccine. She is not interested in getting a HIV screening at this time.   Diet: She is eating healthier  since her last visit. She started a new diet and has lost 20 lb since she started it in December. Exercise: She does not participate in exercise at this time.  Colonoscopy: Not yet completed. Dexa: Not yet completed.  Pap Smear: Not yet completed. Mammogram: Last completed 02/11/2018.   Dental: She is due for a dental exam.  Vision: She is due for a vision exam.    Past Medical History:  Diagnosis Date  . Abnormal Pap smear    leep, 3 normal since  . Gestational diabetes   . Headache(784.0)    teenager  . Hypertension   . Hypothyroid   . Infertility, female   . PP C/S 26w 5d 07/10/11 F (NICU) 07/11/2011    Past Surgical History:  Procedure Laterality Date  . CERVICAL CERCLAGE  04/18/2011   Procedure: CERCLAGE CERVICAL;  Surgeon: Tresa Endo A. Ernestina Penna, MD;  Location: [facility];  Service: Gynecology;  Laterality: N/A;  . CERVICAL CERCLAGE  07/10/2011   Procedure: CERCLAGE CERVICAL;  Surgeon: Tresa Endo A. Ernestina Penna, MD;  Location: [facility];  Service: Gynecology;  Laterality: N/A;  removal  . CESAREAN SECTION  07/10/2011   Procedure: CESAREAN SECTION;  Surgeon: Tresa Endo A. Ernestina Penna, MD;  Location: [facility];  Service: Gynecology;  Laterality: N/A;  . LEEP    . LEEP    . TONSILLECTOMY      Family History  Problem Relation Age of Onset  . Diabetes  Mother   . Hyperlipidemia Mother   . Heart attack Father   . Sudden death Father        77  . Anesthesia problems Neg Hx   . Hypertension Neg Hx   . Cancer Neg Hx     Social History   Socioeconomic History  . Marital status: Married    Spouse name: Not on file  . Number of children: Not on file  . Years of education: Not on file  . Highest education level: Not on file  Occupational History  . Not on file  Tobacco Use  . Smoking status: Former Smoker    Packs/day: 0.50    Types: Cigarettes    Quit date: 2019    Years since quitting: 3.3  . Smokeless tobacco: Never Used  Substance and Sexual Activity  . Alcohol use: Yes    Comment: 1 drink  monthly  . Drug use: No  . Sexual activity: Yes    Partners: Male    Birth control/protection: None    Comment: husband has zero sperm count  Other Topics Concern  . 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 fluconazole (DIFLUCAN) 150 MG tablet TAKE 1 TABLET BY MOUTH TODAY. MY REPEAT IN 3 DAYS IF NEEDED. 2 tablet 0  . levothyroxine (SYNTHROID) 125 MCG tablet TAKE 1 TABLET (125 MCG TOTAL) BY MOUTH DAILY. 90 tablet 1  . meloxicam (MOBIC) 7.5 MG tablet Take 1 tablet (7.5 mg total) by mouth daily. 14 tablet 0  . metroNIDAZOLE (METROGEL) 0.75 % vaginal gel PLACE 1 APPLICATORFUL VAGINALLY AT BEDTIME FOR 5 DAYS. 70 g 0  . SUMAtriptan (IMITREX) 50 MG tablet Take 1 tablet (50 mg total) by mouth every 2 (two) hours as needed for migraine. May repeat in 2 hours if headache persists or recurs. 10 tablet 2   No facility-administered medications prior to visit.    Allergies  Allergen Reactions  . Latex Rash    Review of Systems  HENT: Negative for congestion.   Respiratory: Negative for cough.   Neurological: Negative for headaches.       Objective:    Physical Exam Exam conducted with a chaperone present.  Constitutional:      Appearance: Normal appearance.  HENT:     Head: Normocephalic and atraumatic.     Right Ear: Tympanic membrane and external ear normal.     Left Ear: Tympanic membrane and external ear  normal.  Eyes:     Extraocular Movements: Extraocular movements intact.     Pupils: Pupils are equal, round, and reactive to light.     Comments: No nystagmus.   Cardiovascular:     Rate and Rhythm: Normal rate and regular rhythm.     Pulses: Normal pulses.     Heart sounds: Normal heart sounds.  Pulmonary:     Effort:  Pulmonary effort is normal. No respiratory distress.     Breath sounds: Normal breath sounds. No stridor. No wheezing, rhonchi or rales.  Chest:  Breasts:     Right: Normal. No mass.     Left: Normal. No mass.    Abdominal:     General: Bowel sounds are normal. There is no distension.     Palpations: Abdomen is soft. There is no mass.     Tenderness: There is no abdominal tenderness. There is no guarding or rebound.     Hernia: No hernia is present.  Genitourinary:    Pubic Area: No rash.      Labia:        Left: Lesion (patch of raised lesions consistent with her hx of condyloma ) present.      Vagina: Normal.     Cervix: Normal.     Uterus: Normal.      Adnexa: Right adnexa normal.     Rectum: Normal.       Comments: Bilateral labia minor appear irritated/erythematous Musculoskeletal:     Right knee: No swelling.     Comments: 5/5 strength in both upper and lower extremities. Slight instability on right knee during exam  Skin:    General: Skin is warm and dry.  Neurological:     Mental Status: She is alert and oriented to person, place, and time.     Comments: Normal patellar reflexes  Psychiatric:        Mood and Affect: Affect is tearful (+ tearfulness during interview about her anxiety).        Behavior: Behavior normal.     There were no vitals taken for this visit. Wt Readings from Last 3 Encounters:  01/09/20 274 lb (124.3 kg)  09/28/18 261 lb (118.4 kg)  03/26/18 246 lb (111.6 kg)    Diabetic Foot Exam - Simple   No data filed    Lab Results  Component Value Date   WBC 9.1 07/03/2017   HGB 14.8 07/03/2017   HCT 43.8 07/03/2017    PLT 282.0 07/03/2017   GLUCOSE 86 07/03/2017   CHOL 186 07/03/2017   TRIG 116.0 07/03/2017   HDL 45.30 07/03/2017   LDLDIRECT 127.0 06/23/2016   LDLCALC 118 (H) 07/03/2017   ALT 13 07/03/2017   AST 13 07/03/2017   NA 139 07/03/2017   K 4.3 07/03/2017   CL 106 07/03/2017   CREATININE 0.65 07/03/2017   BUN 13 07/03/2017   CO2 27 07/03/2017   TSH 17.68 (H) 01/09/2020   INR 1.01 04/17/2011   HGBA1C 5.8 06/23/2016    Lab Results  Component Value Date   TSH 17.68 (H) 01/09/2020   Lab Results  Component Value Date   WBC 9.1 07/03/2017   HGB 14.8 07/03/2017   HCT 43.8 07/03/2017   MCV 100.3 (H) 07/03/2017   PLT 282.0 07/03/2017   Lab Results  Component Value Date   NA 139 07/03/2017   K 4.3 07/03/2017   CO2 27 07/03/2017   GLUCOSE 86 07/03/2017   BUN 13 07/03/2017   CREATININE 0.65 07/03/2017   BILITOT 0.6 07/03/2017   ALKPHOS 38 (L) 07/03/2017   AST 13 07/03/2017   ALT 13 07/03/2017   PROT 7.0 07/03/2017   ALBUMIN 4.1 07/03/2017   CALCIUM 9.2 07/03/2017   GFR 107.55 07/03/2017   Lab Results  Component Value Date   CHOL 186 07/03/2017   Lab Results  Component Value Date   HDL 45.30 07/03/2017   Lab  Results  Component Value Date   LDLCALC 118 (H) 07/03/2017   Lab Results  Component Value Date   TRIG 116.0 07/03/2017   Lab Results  Component Value Date   CHOLHDL 4 07/03/2017   Lab Results  Component Value Date   HGBA1C 5.8 06/23/2016       Assessment & Plan:   Problem List Items Addressed This Visit   None      No orders of the defined types were placed in this encounter.   I, Sandford Craze NP, personally preformed the services described in this documentation.  All medical record entries made by the scribe were at my direction and in my presence.  I have reviewed the chart and discharge instructions (if applicable) and agree that the record reflects my personal performance and is accurate and complete. 06/29/2020   I,Shehryar  Baig,acting as a Neurosurgeon for Lemont Fillers, NP.,have documented all relevant documentation on the behalf of Lemont Fillers, NP,as directed by  Lemont Fillers, NP while in the presence of Lemont Fillers, NP.   Shehryar [employer]

## 2020-06-29 NOTE — Assessment & Plan Note (Signed)
Swab will be sent for yeast and BV.  Will rx empirically with diflucan 150mg  once weekly for 3 weeks.

## 2020-06-29 NOTE — Assessment & Plan Note (Signed)
Uncontrolled.  Will initiate zoloft 50mg .  I instructed pt to start 1/2 tablet once daily for 1 week and then increase to a full tablet once daily on week two as tolerated.  Plan follow up in 1 month to evaluate progress.

## 2020-06-29 NOTE — Assessment & Plan Note (Signed)
She has lost 20 pounds with healthier diet and a new job.  Pap performed today. Obtain labs as ordered.  Recommends covid booster- she declines.

## 2020-06-29 NOTE — Assessment & Plan Note (Signed)
New. Will refer her to ortho for further evaluation.

## 2020-06-29 NOTE — Assessment & Plan Note (Signed)
Unchanged per patient- management per gyn.

## 2020-06-29 NOTE — Patient Instructions (Addendum)
Please schedule a routine eye exam.  Please schedule a routine exam.   Please begin zoloft 50 mg. 1/2 tab once daily for 1 week, then increase to a full tab daily on week two.

## 2020-07-02 ENCOUNTER — Other Ambulatory Visit: Payer: Self-pay

## 2020-07-02 ENCOUNTER — Other Ambulatory Visit (INDEPENDENT_AMBULATORY_CARE_PROVIDER_SITE_OTHER): Payer: 59

## 2020-07-02 ENCOUNTER — Telehealth: Payer: Self-pay | Admitting: Family

## 2020-07-02 ENCOUNTER — Ambulatory Visit (HOSPITAL_BASED_OUTPATIENT_CLINIC_OR_DEPARTMENT_OTHER)
Admission: RE | Admit: 2020-07-02 | Discharge: 2020-07-02 | Disposition: A | Discharge status: Home / Self Care | Payer: 59 | Source: Ambulatory Visit | Attending: Family | Admitting: Family

## 2020-07-02 ENCOUNTER — Other Ambulatory Visit (HOSPITAL_BASED_OUTPATIENT_CLINIC_OR_DEPARTMENT_OTHER): Payer: Self-pay

## 2020-07-02 DIAGNOSIS — R945 Abnormal results of liver function studies: Secondary | ICD-10-CM

## 2020-07-02 DIAGNOSIS — Z0000 Encounter for general adult medical examination without abnormal findings: Secondary | ICD-10-CM

## 2020-07-02 DIAGNOSIS — Z1231 Encounter for screening mammogram for malignant neoplasm of breast: Secondary | ICD-10-CM | POA: Insufficient documentation

## 2020-07-02 DIAGNOSIS — E119 Type 2 diabetes mellitus without complications: Secondary | ICD-10-CM

## 2020-07-02 DIAGNOSIS — R7989 Other specified abnormal findings of blood chemistry: Secondary | ICD-10-CM

## 2020-07-02 DIAGNOSIS — Z8639 Personal history of other endocrine, nutritional and metabolic disease: Secondary | ICD-10-CM

## 2020-07-02 LAB — CERVICOVAGINAL ANCILLARY ONLY
Bacterial Vaginitis (gardnerella): POSITIVE — AB
Candida Glabrata: NEGATIVE
Candida Vaginitis: POSITIVE — AB
Comment: NEGATIVE
Comment: NEGATIVE
Comment: NEGATIVE

## 2020-07-02 MED ORDER — METRONIDAZOLE 500 MG PO TABS
500.0000 mg | ORAL_TABLET | Freq: Two times a day (BID) | ORAL | 0 refills | Status: AC
  Filled 2020-07-02: qty 14, 7d supply, fill #0

## 2020-07-02 NOTE — Telephone Encounter (Signed)
Spoke with pt re: elevated sugar in DM range, abnormal LFT, BV (rx'd metronidazole), Yeast (already has diflucan). She will return to lab today for A1C, hepatitis panel. Discussed diet/exercise. She is restarting her synthroid- was out for some time due to lack of insurance.

## 2020-07-03 LAB — CYTOLOGY - PAP
Comment: NEGATIVE
Diagnosis: NEGATIVE
High risk HPV: NEGATIVE

## 2020-07-03 LAB — HEMOGLOBIN A1C: Hgb A1c MFr Bld: 10.4 % — ABNORMAL HIGH (ref 4.6–6.5)

## 2020-07-03 LAB — TSH: TSH: 11.64 u[IU]/mL — ABNORMAL HIGH (ref 0.35–4.50)

## 2020-07-04 ENCOUNTER — Other Ambulatory Visit (HOSPITAL_BASED_OUTPATIENT_CLINIC_OR_DEPARTMENT_OTHER): Payer: Self-pay

## 2020-07-04 ENCOUNTER — Ambulatory Visit (INDEPENDENT_AMBULATORY_CARE_PROVIDER_SITE_OTHER): Payer: 59

## 2020-07-04 ENCOUNTER — Ambulatory Visit (INDEPENDENT_AMBULATORY_CARE_PROVIDER_SITE_OTHER): Payer: 59 | Admitting: Orthopaedic Surgery

## 2020-07-04 ENCOUNTER — Encounter: Payer: Self-pay | Admitting: Family

## 2020-07-04 ENCOUNTER — Telehealth: Payer: Self-pay | Admitting: Family

## 2020-07-04 DIAGNOSIS — M25561 Pain in right knee: Secondary | ICD-10-CM

## 2020-07-04 DIAGNOSIS — E1165 Type 2 diabetes mellitus with hyperglycemia: Secondary | ICD-10-CM

## 2020-07-04 DIAGNOSIS — E039 Hypothyroidism, unspecified: Secondary | ICD-10-CM

## 2020-07-04 DIAGNOSIS — IMO0002 Reserved for concepts with insufficient information to code with codable children: Secondary | ICD-10-CM

## 2020-07-04 HISTORY — DX: Type 2 diabetes mellitus with hyperglycemia: E11.65

## 2020-07-04 HISTORY — DX: Reserved for concepts with insufficient information to code with codable children: IMO0002

## 2020-07-04 LAB — HEPATITIS PANEL, ACUTE
Hep A IgM: NONREACTIVE
Hep B C IgM: NONREACTIVE
Hepatitis B Surface Ag: NONREACTIVE
Hepatitis C Ab: NONREACTIVE
SIGNAL TO CUT-OFF: 0.02 (ref <1.00)

## 2020-07-04 MED ORDER — BLOOD GLUCOSE MONITOR KIT
PACK | 0 refills | Status: AC
  Filled 2020-07-04: qty 1, fill #0
  Filled 2020-07-05 (×2): qty 1, 1d supply, fill #0

## 2020-07-04 MED ORDER — METFORMIN HCL 500 MG PO TABS
500.0000 mg | ORAL_TABLET | Freq: Two times a day (BID) | ORAL | 1 refills | Status: DC
  Filled 2020-07-04: qty 180, 90d supply, fill #0

## 2020-07-04 NOTE — Telephone Encounter (Signed)
See mychart.  

## 2020-07-04 NOTE — Progress Notes (Signed)
Office Visit Note   Patient: Sheila Evans           Date of Birth: 1975-08-15           MRN: [id number] Visit Date: 07/04/2020              Requested by: Sandford Craze, NP [id number] [place],  [place] [address] PCP: Sandford Craze, NP   Assessment & Plan: Visit Diagnoses:  1. Acute pain of right knee     Plan: Right knee patellofemoral dysfunction.  At this point, we have discussed physical therapy as well as a PSO brace.  She still has numerous quadricep strengthening exercises from a home health physical therapist that she would like to try first.  We will provide her with a PSO brace today.  If her symptoms do not improve over the next few months she will follow-up with [place].  Call with concerns or questions in the meantime.  Follow-Up Instructions: Return if symptoms worsen or fail to improve.   Orders:  Orders Placed This Encounter  Procedures  . XR Knee 1-2 Views Right   No orders of the defined types were placed in this encounter.     Procedures: No procedures performed   Clinical Data: No additional findings.   Subjective: Chief Complaint  Patient presents with  . Right Knee - Pain    HPI patient is a very pleasant 45 year old female who comes in today with concerns about her right knee.  Approximately 7 years ago, she first noticed symptoms which included feeling as though her kneecap was popping out of place.  This has occurred every 3 to 6 months since but recently more frequently.  This typically occurs when her hip is in an externally rotated position such as when she is crossing her leg.  The patella appears to be reduced when she extends her leg.  She only notices pain during the time this is happening.  She has recently noticed pain after the occurrence.  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  She has not continued  to do these.  Review of Systems as detailed in HPI.  All others reviewed and are negative.   Objective: Vital Signs: There were no vitals taken for this visit.  Physical Exam well-developed well-nourished female no acute distress.  Alert and oriented x3.  Ortho Exam right knee exam shows somewhat of a valgus deformity.  Increased Q angle.  Range of motion 0 to 120 degrees.  No patellar apprehension.  No joint line tenderness.  She is neurovascular intact distally.  Specialty Comments:  No specialty comments available.  Imaging: XR Knee 1-2 Views Right  Result Date: 07/04/2020 No acute or structural abnormalities    PMFS History: Patient Active Problem List   Diagnosis Date Noted  . Diabetes type 2, uncontrolled (HCC) 07/04/2020  . Acute vaginitis 06/29/2020  . Vaginal candidiasis 06/29/2020  . Anxiety 06/29/2020  . Preventative health care 06/29/2020  . Right knee pain 06/29/2020  . Hyperglycemia 11/16/2012  . Borderline diabetes 11/16/2012  . Hepatic steatosis 11/16/2012  . Condyloma 11/03/2012  . S/P LEEP 11/03/2012  . Elevated LFTs 10/27/2012  . Adjustment disorder with mixed anxiety and depressed mood 10/27/2012  . Morbid obesity (HCC) 10/27/2012  . Anemia 07/20/2012  . Elevated WBC count 07/20/2012  . Hypertriglyceridemia 07/20/2012  . Unspecified hypothyroidism 06/30/2012  .  Thyroid nodule 06/30/2012  . Headache(784.0) 06/28/2012  . Gestational diabetes 06/28/2012  . History of hypothyroidism 06/28/2012  . History of migraine headaches 06/28/2012  . History of abnormal Pap smear 06/28/2012  . Allergic rhinitis 06/28/2012  . Rosacea 06/28/2012  . Left knee pain 05/28/2012  . PP C/S 26w 5d 07/10/11 F (NICU) 07/11/2011  . Twin pregnancy with fetal loss and retention of one fetus (14 wks) 04/17/2011   Past Medical History:  Diagnosis Date  . Abnormal Pap smear    leep, 3 normal since  . Diabetes type 2, uncontrolled (HCC) 07/04/2020  . Gestational diabetes    . Headache(784.0)    teenager  . Hypertension   . Hypothyroid   . Infertility, female   . PP C/S 26w 5d 07/10/11 F (NICU) 07/11/2011    Family History  Problem Relation Age of Onset  . Diabetes Mother   . Hyperlipidemia Mother   . Heart attack Father   . Sudden death Father        6  . CVA Maternal Grandmother   . Diabetes Mellitus II Maternal Grandmother   . CVA Paternal Grandmother   . Alzheimer's disease Paternal Grandmother   . CAD Paternal Grandfather   . Anesthesia problems Neg Hx   . Hypertension Neg Hx   . Cancer Neg Hx     Past Surgical History:  Procedure Laterality Date  . CERVICAL CERCLAGE  04/18/2011   Procedure: CERCLAGE CERVICAL;  Surgeon: Tresa Endo A. Ernestina Penna, MD;  Location: [facility];  Service: Gynecology;  Laterality: N/A;  . CERVICAL CERCLAGE  07/10/2011   Procedure: CERCLAGE CERVICAL;  Surgeon: Tresa Endo A. Ernestina Penna, MD;  Location: [facility];  Service: Gynecology;  Laterality: N/A;  removal  . CESAREAN SECTION  07/10/2011   Procedure: CESAREAN SECTION;  Surgeon: Tresa Endo A. Ernestina Penna, MD;  Location: [facility];  Service: Gynecology;  Laterality: N/A;  . LEEP    . LEEP    . TONSILLECTOMY     Social History   Occupational History  . Not on file  Tobacco Use  . Smoking status: Former Smoker    Packs/day: 0.50    Types: Cigarettes    Quit date: 2019    Years since quitting: 3.3  . Smokeless tobacco: Never Used  Substance and Sexual Activity  . Alcohol use: Not Currently    Comment: 1 drink monthly  . Drug use: No  . Sexual activity: Yes    Partners: Male    Birth control/protection: None    Comment: husband has zero sperm count

## 2020-07-05 ENCOUNTER — Encounter: Payer: Self-pay | Admitting: Family

## 2020-07-05 ENCOUNTER — Other Ambulatory Visit (HOSPITAL_BASED_OUTPATIENT_CLINIC_OR_DEPARTMENT_OTHER): Payer: Self-pay

## 2020-07-06 ENCOUNTER — Other Ambulatory Visit (HOSPITAL_BASED_OUTPATIENT_CLINIC_OR_DEPARTMENT_OTHER): Payer: Self-pay

## 2020-07-06 ENCOUNTER — Other Ambulatory Visit: Payer: Self-pay

## 2020-07-06 MED ORDER — FREESTYLE LANCETS MISC
12 refills | Status: AC
  Filled 2020-07-06: qty 100, 90d supply, fill #0

## 2020-07-06 MED ORDER — GLUCOSE BLOOD VI STRP
ORAL_STRIP | 12 refills | Status: AC
  Filled 2020-07-06: qty 100, 90d supply, fill #0
  Filled 2020-11-15 – 2020-11-26 (×2): qty 100, 90d supply, fill #1

## 2020-07-11 ENCOUNTER — Encounter: Payer: Self-pay | Admitting: Family

## 2020-07-27 ENCOUNTER — Other Ambulatory Visit: Payer: Self-pay

## 2020-07-27 ENCOUNTER — Ambulatory Visit: Payer: 59 | Admitting: Family

## 2020-07-27 DIAGNOSIS — F419 Anxiety disorder, unspecified: Secondary | ICD-10-CM | POA: Diagnosis not present

## 2020-07-27 DIAGNOSIS — E1165 Type 2 diabetes mellitus with hyperglycemia: Secondary | ICD-10-CM | POA: Diagnosis not present

## 2020-07-27 DIAGNOSIS — E039 Hypothyroidism, unspecified: Secondary | ICD-10-CM

## 2020-07-27 MED ORDER — METFORMIN HCL 500 MG PO TABS: 1000.0000 mg | ORAL_TABLET | Freq: Two times a day (BID) | ORAL | 1 refills | Status: DC

## 2020-07-27 NOTE — Assessment & Plan Note (Signed)
Clinically improving back on synthroid. Will plan to check TSH next visit.

## 2020-07-27 NOTE — Assessment & Plan Note (Signed)
Improving. Continue zoloft 50mg  once daily.

## 2020-07-27 NOTE — Assessment & Plan Note (Addendum)
Sugars are improving but still above goal. She is working hard on her diet and has lost 3 pounds since last visit. I commended her on this. Will increase metformin to 1000mg  bid.

## 2020-07-27 NOTE — Progress Notes (Signed)
Subjective:   By signing my name below, I, Sheila Evans, attest that this documentation has been prepared under the direction and in the presence of Sheila Evans. 07/27/2020      Patient ID: Sheila Evans, female    DOB: 22-Dec-1975, 45 y.o.   MRN: [id number]  Chief Complaint  Patient presents with  . Depression    4 week follow up, zoloft  . Diabetes    HPI Patient is in today for a office visit.  Depression/Anxiety- She continues to take 25 mg Zoloft daily PO. She reports having better control over her emotions and often felling mellow. She still does not like going out and finds doing so inconvenient. Her sleep has improved. She assumes the worst case scenario less frequently. Blood sugar- She continuous taking 500 mg metformin 2x daily. She has frequent urination during the night. She measures her blood sugar daily. She reports that her values have been decreasing to around 180-205 fasting. Diet- She no longer eats bread. She eats more vegetables and fruits. She limits her eating portions. She does not drink any sweet drinks and only 1 coffee in the morning. She is drinking more water. She reports having constipation but drinks milk of magnesia to manage it. Wt Readings from Last 3 Encounters:  07/27/20 252 lb (114.3 kg)  06/29/20 255 lb (115.7 kg)  01/09/20 274 lb (124.3 kg)      Health Maintenance Due  Topic Date Due  . PNEUMOCOCCAL POLYSACCHARIDE VACCINE AGE 44-64 HIGH RISK  Never done  . Pneumococcal Vaccine 43-78 Years old (1 of 2 - PPSV23) Never done  . FOOT EXAM  Never done  . OPHTHALMOLOGY EXAM  Never done  . URINE MICROALBUMIN  Never done  . COVID-19 Vaccine (3 - Booster for Moderna series) 02/16/2020    Past Medical History:  Diagnosis Date  . Abnormal Pap smear    leep, 3 normal since  . Diabetes type 2, uncontrolled ([place]) 07/04/2020  . Gestational diabetes   . Headache(784.0)    teenager  . Hypertension   . Hypothyroid   . Infertility,  female   . PP C/S 26w 5d 07/10/11 F (NICU) 07/11/2011    Past Surgical History:  Procedure Laterality Date  . CERVICAL CERCLAGE  04/18/2011   Procedure: CERCLAGE CERVICAL;  Surgeon: Claiborne Billings A. Pamala Hurry, MD;  Location: [facility];  Service: Gynecology;  Laterality: N/A;  . CERVICAL CERCLAGE  07/10/2011   Procedure: CERCLAGE CERVICAL;  Surgeon: Claiborne Billings A. Pamala Hurry, MD;  Location: [facility];  Service: Gynecology;  Laterality: N/A;  removal  . CESAREAN SECTION  07/10/2011   Procedure: CESAREAN SECTION;  Surgeon: Claiborne Billings A. Pamala Hurry, MD;  Location: [facility];  Service: Gynecology;  Laterality: N/A;  . LEEP    . LEEP    . TONSILLECTOMY      Family History  Problem Relation Age of Onset  . Diabetes Mother   . Hyperlipidemia Mother   . Heart attack Father   . Sudden death Father        27  . CVA Maternal Grandmother   . Diabetes Mellitus II Maternal Grandmother   . CVA Paternal Grandmother   . Alzheimer's disease Paternal Grandmother   . CAD Paternal Grandfather   . Anesthesia problems Neg Hx   . Hypertension Neg Hx   . Cancer Neg Hx     Social History   Socioeconomic History  . Marital status: Married    Spouse name: Not on file  . Number  of children: Not on file  . Years of education: Not on file  . Highest education level: Not on file  Occupational History  . Not on file  Tobacco Use  . Smoking status: Former Smoker    Packs/day: 0.50    Types: Cigarettes    Quit date: 2019    Years since quitting: 3.4  . Smokeless tobacco: Never Used  Substance and Sexual Activity  . Alcohol use: Not Currently    Comment: 1 drink monthly  . Drug use: No  . Sexual activity: Yes    Partners: Male    Birth control/protection: None    Comment: husband has zero sperm count  Other Topics Concern  . 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 blood glucose meter kit and supplies KIT Dispense based on patient and insurance preference. Use up to four times daily as directed. 1 each 0  . fluconazole (DIFLUCAN) 150 MG tablet Take 1 tablet by mouth once weekly for 3 weeks. (Patient taking differently: Take 1 tablet by mouth once weekly for 3 weeks.) 3 tablet 0  . glucose blood test strip Use as instructed 100 each 12  . Lancets (FREESTYLE) lancets Use as instructed 100 each 12  . levothyroxine (SYNTHROID) 125 MCG tablet TAKE 1 TABLET (125 MCG TOTAL) BY MOUTH DAILY. 90 tablet 1  . sertraline (ZOLOFT) 50 MG tablet take 1/2 tablet by mouth once daily for 1 week, then increase to a full tablet once daily on week two (Patient taking differently: take 1/2 tablet by mouth once daily for 1 week, then increase to a full tablet once daily on week two) 30 tablet 0  . SUMAtriptan (IMITREX) 50 MG tablet Take 1 tablet (50 mg total) by mouth every 2 (two) hours as needed for migraine. May repeat in 2 hours if headache persists or recurs. 10 tablet 2  . metFORMIN (GLUCOPHAGE) 500 MG tablet Take 1 tablet (500 mg total) by mouth 2 (two) times daily with a meal. 180 tablet 1  . meloxicam (MOBIC) 7.5 MG tablet Take 1 tablet (7.5 mg total) by mouth daily. 14 tablet 0  . metroNIDAZOLE (METROGEL) 0.75 % vaginal gel PLACE 1 APPLICATORFUL VAGINALLY AT BEDTIME FOR 5 DAYS. 70 g 0   No facility-administered medications prior to visit.    Allergies  Allergen Reactions  . Latex Rash    ROS    see HPI Objective:    Physical Exam Constitutional:       General: She is not in acute distress.    Appearance: Normal appearance. She is not ill-appearing.  HENT:     Head: Normocephalic and atraumatic.     Right Ear: External ear normal.     Left Ear: External ear normal.  Eyes:     Extraocular Movements: Extraocular movements intact.     Pupils: Pupils are  equal, round, and reactive to light.  Cardiovascular:     Rate and Rhythm: Normal rate and regular rhythm.     Pulses: Normal pulses.     Heart sounds: Normal heart sounds. No murmur heard. No gallop.   Pulmonary:     Effort: Pulmonary effort is normal. No respiratory distress.     Breath sounds: Normal breath sounds. No wheezing, rhonchi or rales.  Skin:    General: Skin is warm and dry.  Neurological:     Mental Status: She is alert and oriented to person, place, and time.  Psychiatric:        Behavior: Behavior normal.     BP 112/68 (BP Location: Left Arm, Patient Position: Sitting, Cuff Size: Normal)   Pulse 77   Temp (!) 97.2 F (36.2 C)   Resp 17   Ht _0  (1.676 m)   Wt 252 lb (114.3 kg)   SpO2 98%   BMI 40.67 kg/m  Wt Readings from Last 3 Encounters:  07/27/20 252 lb (114.3 kg)  06/29/20 255 lb (115.7 kg)  01/09/20 274 lb (124.3 kg)       Assessment & Plan:   Problem List Items Addressed This Visit      Unprioritized   Hypothyroidism    Clinically improving back on synthroid. Will plan to check TSH next visit.       Diabetes type 2, uncontrolled ([place])    Sugars are improving but still above goal. She is working hard on her diet and has lost 3 pounds since last visit. I commended her on this. Will increase metformin to 1049m bid.       Relevant Medications   metFORMIN (GLUCOPHAGE) 500 MG tablet   Anxiety    Improving. Continue zoloft 551monce daily.           Meds ordered this encounter  Medications  . metFORMIN (GLUCOPHAGE) 500 MG tablet    Sig: Take 2 tablets (1,000 mg total) by mouth 2 (two) times daily with a meal.    Dispense:  180  tablet    Refill:  1    Order Specific Question:   Supervising Provider    Answer:   BLPenni Homans [[id number]]    I, MeNance PearNP, personally preformed the services described in this documentation.  All medical record entries made by the scribe were at my direction and in my presence.  I have reviewed the chart and discharge instructions (if applicable) and agree that the record reflects my personal performance and is accurate and complete. _1 @     MeNance PearNP

## 2020-07-27 NOTE — Patient Instructions (Signed)
Please increase metformin to 1000mg  twice daily.

## 2020-08-03 ENCOUNTER — Other Ambulatory Visit: Payer: Self-pay | Admitting: Family

## 2020-08-03 ENCOUNTER — Other Ambulatory Visit (HOSPITAL_BASED_OUTPATIENT_CLINIC_OR_DEPARTMENT_OTHER): Payer: Self-pay

## 2020-08-03 MED ORDER — SERTRALINE HCL 50 MG PO TABS
50.0000 mg | ORAL_TABLET | Freq: Every day | ORAL | 1 refills | Status: DC
  Filled 2020-08-03: qty 90, 90d supply, fill #0
  Filled 2020-11-06: qty 90, 90d supply, fill #1

## 2020-08-16 ENCOUNTER — Telehealth: Payer: Self-pay

## 2020-08-16 NOTE — Telephone Encounter (Signed)
Called patient no answer LMOM. She received a brace last visit. It did not take a signature on tablet . Wanted to see if she can come in to sign whenever she can (just not during lunch hours)

## 2020-08-31 ENCOUNTER — Other Ambulatory Visit: Payer: Self-pay

## 2020-08-31 ENCOUNTER — Encounter: Payer: Self-pay | Admitting: Family

## 2020-08-31 ENCOUNTER — Other Ambulatory Visit (HOSPITAL_BASED_OUTPATIENT_CLINIC_OR_DEPARTMENT_OTHER): Payer: Self-pay

## 2020-08-31 MED ORDER — METFORMIN HCL 1000 MG PO TABS
1000.0000 mg | ORAL_TABLET | Freq: Two times a day (BID) | ORAL | 1 refills | Status: AC
  Filled 2020-08-31: qty 180, 90d supply, fill #0
  Filled 2020-11-26: qty 180, 90d supply, fill #1

## 2020-10-01 ENCOUNTER — Other Ambulatory Visit (HOSPITAL_BASED_OUTPATIENT_CLINIC_OR_DEPARTMENT_OTHER): Payer: Self-pay

## 2020-10-01 MED FILL — Levothyroxine Sodium Tab 125 MCG: ORAL | 60 days supply | Qty: 60 | Fill #1 | Status: AC

## 2020-10-08 ENCOUNTER — Ambulatory Visit: Payer: 59 | Admitting: Family

## 2020-10-08 ENCOUNTER — Other Ambulatory Visit: Payer: Self-pay

## 2020-10-08 ENCOUNTER — Other Ambulatory Visit (HOSPITAL_BASED_OUTPATIENT_CLINIC_OR_DEPARTMENT_OTHER): Payer: Self-pay

## 2020-10-08 VITALS — BP 138/63 | HR 71 | Temp 98.7°F | Resp 16 | Ht 66.0 in | Wt 247.0 lb

## 2020-10-08 DIAGNOSIS — R7989 Other specified abnormal findings of blood chemistry: Secondary | ICD-10-CM | POA: Insufficient documentation

## 2020-10-08 DIAGNOSIS — F4323 Adjustment disorder with mixed anxiety and depressed mood: Secondary | ICD-10-CM | POA: Diagnosis not present

## 2020-10-08 DIAGNOSIS — E039 Hypothyroidism, unspecified: Secondary | ICD-10-CM | POA: Diagnosis not present

## 2020-10-08 DIAGNOSIS — E1165 Type 2 diabetes mellitus with hyperglycemia: Secondary | ICD-10-CM | POA: Diagnosis not present

## 2020-10-08 DIAGNOSIS — R945 Abnormal results of liver function studies: Secondary | ICD-10-CM

## 2020-10-08 LAB — COMPREHENSIVE METABOLIC PANEL
ALT: 34 U/L (ref 0–35)
AST: 25 U/L (ref 0–37)
Albumin: 4.5 g/dL (ref 3.5–5.2)
Alkaline Phosphatase: 49 U/L (ref 39–117)
BUN: 9 mg/dL (ref 6–23)
CO2: 25 mEq/L (ref 19–32)
Calcium: 9.6 mg/dL (ref 8.4–10.5)
Chloride: 100 mEq/L (ref 96–112)
Creatinine, Ser: 0.7 mg/dL (ref 0.40–1.20)
GFR: 106.38 mL/min (ref >60.00)
Glucose, Bld: 165 mg/dL — ABNORMAL HIGH (ref 70–99)
Potassium: 4.3 mEq/L (ref 3.5–5.1)
Sodium: 135 mEq/L (ref 135–145)
Total Bilirubin: 0.7 mg/dL (ref 0.2–1.2)
Total Protein: 7.6 g/dL (ref 6.0–8.3)

## 2020-10-08 LAB — HEMOGLOBIN A1C: Hgb A1c MFr Bld: 6.9 % — ABNORMAL HIGH (ref 4.6–6.5)

## 2020-10-08 LAB — MICROALBUMIN / CREATININE URINE RATIO
Creatinine,U: 55.3 mg/dL
Microalb Creat Ratio: 4.8 mg/g (ref 0.0–30.0)
Microalb, Ur: 2.7 mg/dL — ABNORMAL HIGH (ref 0.0–1.9)

## 2020-10-08 LAB — TSH: TSH: 5.56 u[IU]/mL — ABNORMAL HIGH (ref 0.35–5.50)

## 2020-10-08 MED ORDER — FREESTYLE LIBRE 2 READER DEVI
0 refills | Status: AC
  Filled 2020-10-08: qty 1, 14d supply, fill #0

## 2020-10-08 MED ORDER — FREESTYLE LIBRE 2 SENSOR MISC
1.0000 | 6 refills | Status: AC
  Filled 2020-10-08: qty 2, 28d supply, fill #0
  Filled 2020-11-07: qty 2, 28d supply, fill #1
  Filled 2020-11-26: qty 2, 28d supply, fill #2

## 2020-10-08 MED ORDER — FREESTYLE LIBRE 14 DAY SENSOR MISC
4 refills | Status: AC
  Filled 2020-10-08: qty 6, 84d supply, fill #0

## 2020-10-08 NOTE — Assessment & Plan Note (Signed)
Clinically stable on synthroid with reported improved compliance. Obtain follow up TSH.

## 2020-10-08 NOTE — Patient Instructions (Addendum)
Please call to schedule a diabetic eye exam.  Please complete lab work prior to leaving.

## 2020-10-08 NOTE — Addendum Note (Signed)
Addended by: Sandford Craze on: 10/08/2020 01:01 PM   Modules accepted: Orders

## 2020-10-08 NOTE — Assessment & Plan Note (Signed)
She did not follow through with recommended [place]. Has been working hard on diet/exercise/weight loss. Wishes to recheck LFT.

## 2020-10-08 NOTE — Progress Notes (Signed)
Subjective:   By signing my name below, I, [name], attest that this documentation has been prepared under the direction and in the presence of [name]. 10/08/2020    Patient ID: Sheila Evans, female    DOB: Aug 23, 1975, 45 y.o.   MRN: [id number]  Chief Complaint  Patient presents with   Diabetes    Here for follow up   Hypothyroidism    Here for follow up    HPI Patient is in today for a office visit.  Blood sugar- Her last a1c level was elevated. She regularly measures her blood pressure and reports they average from 124-150. She is requesting a new blood sugar measuring device that measures from her arm. She reports that her fingers hurt from her old device. She is also requesting to see an endocrinologist in this office. She is also requesting to switch medications because she has trouble taking them on time because of her work schedule.   Lab Results  Component Value Date   HGBA1C 10.4 (H) 07/02/2020   Thyroid- She continues taking 125 mcg synthroid daily PO and reports no new issues while taking it.  Mood- She continues taking 50 mg Zoloft daily PO and reports having some issues with intimacy (sexual side effects) but notes that she has resolved most of these issues with her husband.  Vision- She is due for vision care and is planning to set up an appointment with her vision specialist.  Diet- She reports cutting down fried food and carbohydrates. She is interested in getting a liver function test 6 months after her last one.  Immunizations- She has the Covid-19 vaccines and is not interested in getting the booster vaccines at this time.  She is eligible for the pneumonia vaccine and is not interested in getting the pneumonia vaccine at this time.    Health Maintenance Due  Topic Date Due   OPHTHALMOLOGY EXAM  Never done   URINE MICROALBUMIN  Never done   COVID-19 Vaccine (3 - Booster for Moderna series) 02/16/2020   INFLUENZA VACCINE   09/24/2020    Past Medical History:  Diagnosis Date   Abnormal Pap smear    leep, 3 normal since   Diabetes type 2, uncontrolled ([place]) 07/04/2020   Gestational diabetes    Headache(784.0)    teenager   Hypertension    Hypothyroid    Infertility, female    PP C/S 26w 5d 07/10/11 F (NICU) 07/11/2011    Past Surgical History:  Procedure Laterality Date   CERVICAL CERCLAGE  04/18/2011   Procedure: CERCLAGE CERVICAL;  Surgeon: Claiborne Billings A. Pamala Hurry, MD;  Location: [facility];  Service: Gynecology;  Laterality: N/A;   CERVICAL CERCLAGE  07/10/2011   Procedure: CERCLAGE CERVICAL;  Surgeon: Claiborne Billings A. Pamala Hurry, MD;  Location: [facility];  Service: Gynecology;  Laterality: N/A;  removal   CESAREAN SECTION  07/10/2011   Procedure: CESAREAN SECTION;  Surgeon: Claiborne Billings A. Pamala Hurry, MD;  Location: [facility];  Service: Gynecology;  Laterality: N/A;   LEEP     LEEP     TONSILLECTOMY      Family History  Problem Relation Age of Onset   Diabetes Mother    Hyperlipidemia Mother    Heart attack Father    Sudden death Father        65   CVA Maternal Grandmother    Diabetes Mellitus II Maternal Grandmother    CVA Paternal Grandmother    Alzheimer's disease Paternal Grandmother    CAD  Paternal Grandfather    Anesthesia problems Neg Hx    Hypertension Neg Hx    Cancer Neg Hx     Social History   Socioeconomic History   Marital status: Married    Spouse name: Not on file   Number of children: Not on file   Years of education: Not on file   Highest education level: Not on file  Occupational History   Not on file  Tobacco Use   Smoking status: Former    Packs/day: 0.50    Types: Cigarettes    Quit date: 2019    Years since quitting: 3.6   Smokeless tobacco: Never  Substance and Sexual Activity   Alcohol use: Not Currently    Comment: 1 drink monthly   Drug use: No   Sexual activity: Yes    Partners: Male    Birth control/protection: None    Comment: husband has zero sperm count  Other Topics  Concern   Not on file  Social History Narrative   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 Use up to four times daily as directed. 1 each 0   glucose blood test strip Use as instructed 100 each 12   Lancets (FREESTYLE) lancets Use as instructed 100 each 12   levothyroxine (SYNTHROID) 125 MCG tablet TAKE 1 TABLET (125 MCG TOTAL) BY MOUTH DAILY. 90 tablet 1   metFORMIN (GLUCOPHAGE) 1000 MG tablet Take 1 tablet (1,000 mg total) by mouth 2 (two) times daily with a meal. 180 tablet 1   sertraline (ZOLOFT) 50 MG tablet Take 1 tablet (50 mg total) by mouth daily. 90 tablet 1   SUMAtriptan (IMITREX) 50 MG tablet Take 1 tablet (50 mg total) by mouth every 2 (two) hours as needed for migraine. May repeat in 2 hours if headache persists or recurs. 10 tablet 2   fluconazole (DIFLUCAN) 150 MG tablet Take 1 tablet by mouth once weekly for 3 weeks. (Patient taking differently: Take 1 tablet by mouth once weekly for 3 weeks.) 3 tablet 0   No facility-administered medications prior to visit.    Allergies  Allergen Reactions   Latex Rash    ROS See HPI    Objective:    Physical Exam Constitutional:      General: She is not in acute distress.    Appearance: Normal appearance. She is not ill-appearing.  HENT:     Head: Normocephalic and atraumatic.     Right Ear: External ear  normal.     Left Ear: External ear normal.  Eyes:     Extraocular Movements: Extraocular movements intact.     Pupils: Pupils are equal, round, and reactive to light.  Cardiovascular:     Rate and Rhythm: Normal rate and regular rhythm.     Pulses:  Dorsalis pedis pulses are 2+ on the right side and 2+ on the left side.       Posterior tibial pulses are 2+ on the right side and 2+ on the left side.     Heart sounds: Normal heart sounds. No murmur heard.   No gallop.  Pulmonary:     Effort: Pulmonary effort is normal. No respiratory distress.     Breath sounds: Normal breath sounds. No wheezing or rales.  Skin:    General: Skin is warm and dry.  Neurological:     Mental Status: She is alert and oriented to person, place, and time.  Psychiatric:        Behavior: Behavior normal.    BP 138/63 (BP Location: Right Arm, Patient Position: Sitting, Cuff Size: Large)   Pulse 71   Temp 98.7 F (37.1 C) (Oral)   Resp 16   Ht 5\' 6"  (1.676 m)   Wt 247 lb (112 kg)   SpO2 99%   BMI 39.87 kg/m  Wt Readings from Last 3 Encounters:  10/08/20 247 lb (112 kg)  07/27/20 252 lb (114.3 kg)  06/29/20 255 lb (115.7 kg)       Assessment & Plan:   Problem List Items Addressed This Visit       Unprioritized   Hypothyroidism    Clinically stable on synthroid with reported improved compliance. Obtain follow up TSH.       Relevant Orders   TSH   Diabetes type 2, uncontrolled ([place]) - Primary    Per report, her sugars are improving. She is requesting referral to endocrinology. Also requesting rx for [employer]. Continue metformin 1000mg  bid and obtain follow up A1C. Recommended that she also complete a diabetic eye exam. Declines pneumonia shot.       Relevant Medications   Continuous Blood Gluc Sensor (FREESTYLE LIBRE 2 SENSOR) MISC   Other Relevant Orders   Hemoglobin A1c   Urine Microalbumin w/creat. ratio   Ambulatory referral to Endocrinology   Adjustment disorder with  mixed anxiety and depressed mood    Improved. Continue Zoloft 50mg .       Abnormal LFTs    She did not follow through with recommended [place]. Has been working hard on diet/exercise/weight loss. Wishes to recheck LFT.       Relevant Orders   Comp Met (CMET)     Meds ordered this encounter  Medications   Continuous Blood Gluc Receiver (FREESTYLE LIBRE 2 READER) DEVI    Sig: Use as directed    Dispense:  1 each    Refill:  0    Order Specific Question:   Supervising Provider    Answer:   Penni Homans A [[id number]]   Continuous Blood Gluc Sensor (FREESTYLE LIBRE 2 SENSOR) MISC    Sig: Use as directed every 14 days    Dispense:  2 each    Refill:  6    I, [name], personally preformed the services described in this documentation.  All medical record entries made by the scribe were at my direction and in my presence.  I have reviewed the chart and discharge instructions (if applicable) and agree that the record reflects my personal performance and is accurate and complete. 10/08/2020   I,[name],acting as a Education administrator for Nance Pear, NP.,have documented all relevant documentation on the behalf of Nance Pear, NP,as directed by  Nance Pear, NP while in the presence of Nance Pear, NP.   Millicent Blazejewski  S O'Sullivan, NP  

## 2020-10-08 NOTE — Assessment & Plan Note (Signed)
Improved. Continue Zoloft 50mg .

## 2020-10-08 NOTE — Assessment & Plan Note (Signed)
Per report, her sugars are improving. She is requesting referral to endocrinology. Also requesting rx for [employer]. Continue metformin 1000mg  bid and obtain follow up A1C. Recommended that she also complete a diabetic eye exam. Declines pneumonia shot.

## 2020-10-09 ENCOUNTER — Telehealth: Payer: Self-pay | Admitting: Family

## 2020-10-09 ENCOUNTER — Encounter: Payer: Self-pay | Admitting: Family

## 2020-10-09 ENCOUNTER — Other Ambulatory Visit (HOSPITAL_BASED_OUTPATIENT_CLINIC_OR_DEPARTMENT_OTHER): Payer: Self-pay

## 2020-10-09 DIAGNOSIS — R809 Proteinuria, unspecified: Secondary | ICD-10-CM

## 2020-10-09 DIAGNOSIS — E1129 Type 2 diabetes mellitus with other diabetic kidney complication: Secondary | ICD-10-CM

## 2020-10-09 HISTORY — DX: Proteinuria, unspecified: R80.9

## 2020-10-09 HISTORY — DX: Type 2 diabetes mellitus with other diabetic kidney complication: E11.29

## 2020-10-09 MED ORDER — LEVOTHYROXINE SODIUM 150 MCG PO TABS
150.0000 ug | ORAL_TABLET | Freq: Every day | ORAL | 1 refills | Status: AC
  Filled 2020-10-09: qty 90, 90d supply, fill #0

## 2020-10-09 MED ORDER — LISINOPRIL 2.5 MG PO TABS
2.5000 mg | ORAL_TABLET | Freq: Every day | ORAL | 1 refills | Status: AC
  Filled 2020-10-09: qty 90, 90d supply, fill #0

## 2020-10-09 NOTE — Telephone Encounter (Signed)
Please advise pt that her sugar looks great and A1C is at goal at 6.9.  I don't think that she needs to see endocrinology at this point since her sugar is at goal. Would she like to hold off of this referral?  If so, I will cancel.  There is some microscopic protein in her urine which is likely due to her diabetes.  Best treatment for this is to add a low dose ace inhibitor medication for kidney protection along with strict sugar and BP control to decrease progression of kidney damage.  I will send lisinopril for her to begin and she will need to repeat bmet in 1 week, dx microalbuminuria.  TSH is improving but still needs adjustment. Please increase synthroid from to .  Repeat TSH in 6 weeks.  Dx hypothyroid.

## 2020-10-10 NOTE — Telephone Encounter (Signed)
Called but no answer, lvm for patient to call back about results °

## 2020-10-11 ENCOUNTER — Encounter: Payer: Self-pay | Admitting: Family

## 2020-10-11 ENCOUNTER — Other Ambulatory Visit: Payer: Self-pay

## 2020-10-11 DIAGNOSIS — E039 Hypothyroidism, unspecified: Secondary | ICD-10-CM

## 2020-10-11 DIAGNOSIS — R809 Proteinuria, unspecified: Secondary | ICD-10-CM

## 2020-10-11 NOTE — Telephone Encounter (Signed)
Patient messaged back on her Mychart, this information was sent to her on a message. Orders for labs entered she was advised to call for the appointments

## 2020-10-17 ENCOUNTER — Encounter: Payer: Self-pay | Admitting: Family

## 2020-10-24 ENCOUNTER — Other Ambulatory Visit: Payer: Self-pay

## 2020-10-24 ENCOUNTER — Other Ambulatory Visit (INDEPENDENT_AMBULATORY_CARE_PROVIDER_SITE_OTHER): Payer: 59

## 2020-10-24 DIAGNOSIS — R809 Proteinuria, unspecified: Secondary | ICD-10-CM

## 2020-10-25 LAB — BASIC METABOLIC PANEL
BUN: 11 mg/dL (ref 6–23)
CO2: 24 mEq/L (ref 19–32)
Calcium: 10 mg/dL (ref 8.4–10.5)
Chloride: 99 mEq/L (ref 96–112)
Creatinine, Ser: 0.98 mg/dL (ref 0.40–1.20)
GFR: 71.02 mL/min (ref >60.00)
Glucose, Bld: 159 mg/dL — ABNORMAL HIGH (ref 70–99)
Potassium: 4.2 mEq/L (ref 3.5–5.1)
Sodium: 135 mEq/L (ref 135–145)

## 2020-10-29 ENCOUNTER — Encounter: Payer: Self-pay | Admitting: Family

## 2020-10-30 ENCOUNTER — Other Ambulatory Visit (HOSPITAL_BASED_OUTPATIENT_CLINIC_OR_DEPARTMENT_OTHER): Payer: Self-pay

## 2020-10-30 MED ORDER — DEXCOM G6 TRANSMITTER MISC
4 refills | Status: DC
  Filled 2020-10-30 – 2020-11-07 (×3): qty 1, 90d supply, fill #0

## 2020-10-30 MED ORDER — DEXCOM G6 SENSOR MISC
12 refills | Status: DC
  Filled 2020-10-30 – 2020-11-07 (×3): qty 3, 30d supply, fill #0

## 2020-10-30 MED ORDER — DEXCOM G6 RECEIVER DEVI
0 refills | Status: DC
  Filled 2020-10-30: qty 1, 30d supply, fill #0
  Filled 2020-11-05 (×2): qty 1, 90d supply, fill #0
  Filled 2020-11-07: qty 1, 1d supply, fill #0

## 2020-10-31 ENCOUNTER — Other Ambulatory Visit (HOSPITAL_BASED_OUTPATIENT_CLINIC_OR_DEPARTMENT_OTHER): Payer: Self-pay

## 2020-11-01 ENCOUNTER — Telehealth: Payer: Self-pay

## 2020-11-01 NOTE — Telephone Encounter (Signed)
Dexcom Transmitter:  Approved today The request has been approved. The authorization is effective for a maximum of 4 fills from 11/01/2020 to 10/31/2021, as long as the member is enrolled in their current health plan. The request was approved as submitted. This request has been approved for 1 transmitter per 90 days. A written notification letter will follow with additional details.  Dexcom Sensor:   Approved today The request has been approved. The authorization is effective for a maximum of 12 fills from 11/01/2020 to 10/31/2021, as long as the member is enrolled in their current health plan. The request was approved as submitted. This request has been approved for 3 sensors (1 kit) per 30 days. A written notification letter will follow with additional details.  Dexcom Receiver:  Denied on September 9 The request has been partially denied. The authorization is effective for a maximum of 1 fills from 11/01/2020 to 10/31/2021, as long as the member is enrolled in their current health plan. This request is approved with the following quantity limits: 1 receiver per 12 months.We were asked to cover a larger amount of the drug listed at the top of this letter than allowed under our prior authorization rule. Your provider requested a quantity of one (1) Dexcom G6 receiver per 30 days, but for the treatment of type 2 diabetes mellitus (a chronic condition in which your body is resistant to insulin, a hormone that regulates your blood sugar), our guideline named CONTINUOUS GLUCOSE MONITORS - STAND-ALONE, which includes Dexcom G6, allows for approval of a maximum of one (1) Dexcom G6 receiver per 12 months. This is why you were approved for a lower quantity than requested. Contact your provider to discuss use of this product at the quantity that has been approved and other alternatives further. A written notification letter will follow with additional details.

## 2020-11-01 NOTE — Telephone Encounter (Signed)
PA initiated for DexcomG6

## 2020-11-02 ENCOUNTER — Other Ambulatory Visit (HOSPITAL_BASED_OUTPATIENT_CLINIC_OR_DEPARTMENT_OTHER): Payer: Self-pay

## 2020-11-05 ENCOUNTER — Other Ambulatory Visit (HOSPITAL_BASED_OUTPATIENT_CLINIC_OR_DEPARTMENT_OTHER): Payer: Self-pay

## 2020-11-06 ENCOUNTER — Other Ambulatory Visit (HOSPITAL_BASED_OUTPATIENT_CLINIC_OR_DEPARTMENT_OTHER): Payer: Self-pay

## 2020-11-06 NOTE — Telephone Encounter (Signed)
See mychart.  

## 2020-11-07 ENCOUNTER — Other Ambulatory Visit (HOSPITAL_BASED_OUTPATIENT_CLINIC_OR_DEPARTMENT_OTHER): Payer: Self-pay

## 2020-11-08 ENCOUNTER — Other Ambulatory Visit (HOSPITAL_BASED_OUTPATIENT_CLINIC_OR_DEPARTMENT_OTHER): Payer: Self-pay

## 2020-11-12 ENCOUNTER — Other Ambulatory Visit: Payer: Self-pay

## 2020-11-12 ENCOUNTER — Ambulatory Visit: Payer: 59 | Admitting: Family

## 2020-11-12 ENCOUNTER — Encounter: Payer: Self-pay | Admitting: Family

## 2020-11-12 ENCOUNTER — Other Ambulatory Visit (HOSPITAL_BASED_OUTPATIENT_CLINIC_OR_DEPARTMENT_OTHER): Payer: Self-pay

## 2020-11-12 VITALS — BP 134/92 | HR 84 | Temp 98.4°F | Resp 16 | Ht 66.0 in | Wt 249.0 lb

## 2020-11-12 DIAGNOSIS — E119 Type 2 diabetes mellitus without complications: Secondary | ICD-10-CM | POA: Diagnosis not present

## 2020-11-12 DIAGNOSIS — R809 Proteinuria, unspecified: Secondary | ICD-10-CM | POA: Diagnosis not present

## 2020-11-12 DIAGNOSIS — E1165 Type 2 diabetes mellitus with hyperglycemia: Secondary | ICD-10-CM | POA: Diagnosis not present

## 2020-11-12 DIAGNOSIS — Z23 Encounter for immunization: Secondary | ICD-10-CM | POA: Diagnosis not present

## 2020-11-12 DIAGNOSIS — E039 Hypothyroidism, unspecified: Secondary | ICD-10-CM | POA: Diagnosis not present

## 2020-11-12 MED ORDER — SERTRALINE HCL 50 MG PO TABS
50.0000 mg | ORAL_TABLET | Freq: Every day | ORAL | 1 refills | Status: AC
  Filled 2020-11-12: qty 90, 90d supply, fill #0

## 2020-11-12 MED ORDER — SUMATRIPTAN SUCCINATE 50 MG PO TABS
50.0000 mg | ORAL_TABLET | ORAL | 5 refills | Status: AC | PRN
  Filled 2020-11-12 – 2020-11-26 (×2): qty 9, 15d supply, fill #0

## 2020-11-12 NOTE — Progress Notes (Signed)
Subjective:     Patient ID: Sheila Evans, female    DOB: 1975-11-09, 45 y.o.   MRN: [id number]  Chief Complaint  Patient presents with   Follow-up    "Will like to have full medical assessment before moving to [place] 11-29-20"   Diabetes    Here for follow up   Depression    "Doing well"    Insomnia    Complains of insomnia in the last 3 weeks    Diabetes  Depression        Associated symptoms include insomnia. Insomnia PMH includes: depression.   Patient is in today for follow up.  DM2- maintained on metformin 105m bid.    Lab Results  Component Value Date   HGBA1C 6.9 (H) 10/08/2020   HGBA1C 10.4 (H) 07/02/2020   HGBA1C 5.8 06/23/2016   Lab Results  Component Value Date   MICROALBUR 2.7 (H) 10/08/2020   LDLCALC 118 (H) 07/03/2017   CREATININE 0.98 10/24/2020   Hypothyroid-  Lab Results  Component Value Date   TSH 5.56 (H) 10/08/2020   Anxiety/Depression- reports zoloft is helping tremendously.  She is having trouble falling asleep and staying asleep.    Migraines- Had one migraine, resolved with imitrex.    Health Maintenance Due  Topic Date Due   OPHTHALMOLOGY EXAM  Never done   COVID-19 Vaccine (3 - Booster for Moderna series) 02/16/2020    Past Medical History:  Diagnosis Date   Abnormal Pap smear    leep, 3 normal since   Diabetes type 2, uncontrolled ([place] 07/04/2020   Gestational diabetes    Headache(784.0)    teenager   Hypertension    Hypothyroid    Infertility, female    Microalbuminuria 10/09/2020   PP C/S 26w 5d 07/10/11 F (NICU) 07/11/2011   Type 2 diabetes mellitus, controlled, with renal complications ([facility] 83/53/2992   Past Surgical History:  Procedure Laterality Date   CERVICAL CERCLAGE  04/18/2011   Procedure: CERCLAGE CERVICAL;  Surgeon: KClaiborne BillingsA. FPamala Hurry MD;  [facility];  Service: Gynecology;  Laterality: N/A;   CERVICAL CERCLAGE  07/10/2011   Procedure: CERCLAGE CERVICAL;  Surgeon: KClaiborne BillingsA. FPamala Hurry MD;   [facility];  Service: Gynecology;  Laterality: N/A;  removal   CESAREAN SECTION  07/10/2011   Procedure: CESAREAN SECTION;  Surgeon: KClaiborne BillingsA. FPamala Hurry MD;  [facility];  Service: Gynecology;  Laterality: N/A;   LEEP     LEEP     TONSILLECTOMY      Family History  Problem Relation Age of Onset   Diabetes Mother    Hyperlipidemia Mother    Heart attack Father    Sudden death Father        548  CVA Maternal Grandmother    Diabetes Mellitus II Maternal Grandmother    CVA Paternal Grandmother    Alzheimer's disease Paternal Grandmother    CAD Paternal Grandfather    Anesthesia problems Neg Hx    Hypertension Neg Hx    Cancer Neg Hx     Social History   Socioeconomic History   Marital status: Married    Spouse name: Not on file   Number of children: Not on file   Years of education: Not on file   Highest education level: Not on file  Occupational History   Not on file  Tobacco Use   Smoking status: Former    Packs/day: 0.50    Types: Cigarettes    Quit date: 2019  Years since quitting: 3.7   Smokeless tobacco: Never  Substance and Sexual Activity   Alcohol use: Not Currently    Comment: 1 drink monthly   Drug use: No   Sexual activity: Yes    Partners: Male    Birth control/protection: None    Comment: husband has zero sperm count  Other Topics Concern   Not on file  Social History Narrative   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 Use up to four times daily as directed. 1 each 0   Continuous Blood Gluc Receiver (FREESTYLE LIBRE 2 READER) DEVI Use as directed 1 each 0   Continuous Blood Gluc Sensor (FREESTYLE LIBRE 14 DAY SENSOR) MISC Use as directed to check blood sugar 6 each 4   Continuous Blood Gluc Sensor (FREESTYLE LIBRE 2 SENSOR) MISC Use as directed every 14 days 2 each 6   glucose blood test strip Use as instructed 100 each 12   Lancets (FREESTYLE) lancets Use as instructed 100 each 12   levothyroxine (SYNTHROID) 150 MCG tablet Take 1 tablet (150 mcg total) by mouth daily. 90 tablet 1   lisinopril (ZESTRIL) 2.5 MG tablet Take 1 tablet (2.5 mg total) by mouth daily. 90 tablet 1   metFORMIN (GLUCOPHAGE) 1000 MG tablet Take 1 tablet (1,000 mg total) by mouth 2 (two) times daily with a meal. 180 tablet 1   sertraline (ZOLOFT) 50 MG tablet Take 1 tablet (50 mg total) by mouth daily. 90 tablet 1   SUMAtriptan (IMITREX) 50 MG tablet Take 1 tablet (50 mg total) by mouth every 2 (two) hours as needed for migraine. May repeat in 2 hours if headache persists or recurs. 10 tablet 2   Continuous Blood Gluc Receiver (DEXCOM G6 RECEIVER) DEVI Use for continuous blood glucose monitoring 1 each 0   Continuous Blood Gluc Sensor (DEXCOM G6 SENSOR) MISC Use for continuous blood glucose monitoring, replace sensor every 10 days 3 each 12   Continuous Blood Gluc Transmit (DEXCOM G6 TRANSMITTER) MISC Use for continuous blood glucose monitoring, replace transmitter every 90 days 1 each 4   No facility-administered medications prior to visit.    Allergies  Allergen Reactions   Latex Rash    Review of Systems  Psychiatric/Behavioral:  Positive for depression. The patient has insomnia.       Objective:    Physical Exam Constitutional:      Appearance: She is well-developed.  Cardiovascular:     Rate and Rhythm: Normal rate and  regular rhythm.     Heart sounds: Normal heart sounds. No murmur heard. Pulmonary:     Effort: Pulmonary effort is normal. No respiratory distress.     Breath sounds: Normal breath sounds. No wheezing.  Psychiatric:        Behavior: Behavior normal.  Thought Content: Thought content normal.        Judgment: Judgment normal.    BP (!) 134/92 (BP Location: Right Arm, Patient Position: Sitting, Cuff Size: Large)   Pulse 84   Temp 98.4 F (36.9 C) (Oral)   Resp 16   Ht '5\' 6"'  (1.676 m)   Wt 249 lb (112.9 kg)   SpO2 98%   BMI 40.19 kg/m  Wt Readings from Last 3 Encounters:  11/12/20 249 lb (112.9 kg)  10/08/20 247 lb (112 kg)  07/27/20 252 lb (114.3 kg)       Assessment & Plan:   Problem List Items Addressed This Visit       Unprioritized   Microalbuminuria    Continue ACE for renal protection.       Hypothyroidism    Clinically stable on synthroid. 150 mcg once daily.       Relevant Orders   TSH   Diabetes type 2, uncontrolled ([place])    Sugar is much improved. Obtain follow up A1C.       Other Visit Diagnoses     Needs flu shot    -  Primary   Relevant Orders   Flu Vaccine QUAD 6+ mos PF IM (Fluarix Quad PF) (Completed)   Controlled type 2 diabetes mellitus without complication, without long-term current use of insulin ([facility])       Relevant Orders   Lipid panel     We discussed trial of benadryl 12.34m PO HS PRN insomnia. Notes 262mhas been too strong for her.   I have discontinued HeMarsh DollyHunter's Dexcom G6 Sensor, Dexcom G6 Receiver, and DeMedia plannerI am also having her maintain her blood glucose meter kit and supplies, freestyle, glucose blood, metFORMIN, FreeStyle Libre 2 Reader, FrFirst Data CorporationFreeStyle Libre 14 Day Sensor, levothyroxine, lisinopril, sertraline, and SUMAtriptan.  Meds ordered this encounter  Medications   sertraline (ZOLOFT) 50 MG tablet    Sig: Take 1 tablet (50 mg total) by mouth daily.    Dispense:   90 tablet    Refill:  1    Order Specific Question:   Supervising Provider    Answer:   BLPenni Homans [[id number]]   SUMAtriptan (IMITREX) 50 MG tablet    Sig: Take 1 tablet (50 mg total) by mouth every 2 (two) hours as needed for migraine. May repeat in 2 hours if headache persists or recurs.    Dispense:  10 tablet    Refill:  5    Order Specific Question:   Supervising Provider    Answer:   BLPenni Homans [[id number]]

## 2020-11-12 NOTE — Assessment & Plan Note (Signed)
Sugar is much improved. Obtain follow up A1C.

## 2020-11-12 NOTE — Assessment & Plan Note (Signed)
Continue ACE for renal protection.

## 2020-11-12 NOTE — Assessment & Plan Note (Signed)
Clinically stable on synthroid. 150 mcg once daily.

## 2020-11-12 NOTE — Patient Instructions (Signed)
Please complete lab work prior to leaving.  Good luck with your move.   

## 2020-11-13 LAB — LIPID PANEL
Cholesterol: 218 mg/dL — ABNORMAL HIGH (ref 0–200)
HDL: 43.7 mg/dL (ref >39.00)
LDL Cholesterol: 135 mg/dL — ABNORMAL HIGH (ref 0–99)
NonHDL: 174.67
Total CHOL/HDL Ratio: 5
Triglycerides: 198 mg/dL — ABNORMAL HIGH (ref 0.0–149.0)
VLDL: 39.6 mg/dL (ref 0.0–40.0)

## 2020-11-13 LAB — TSH: TSH: 4.16 u[IU]/mL (ref 0.35–5.50)

## 2020-11-15 ENCOUNTER — Other Ambulatory Visit (HOSPITAL_BASED_OUTPATIENT_CLINIC_OR_DEPARTMENT_OTHER): Payer: Self-pay

## 2020-11-19 ENCOUNTER — Other Ambulatory Visit (HOSPITAL_BASED_OUTPATIENT_CLINIC_OR_DEPARTMENT_OTHER): Payer: Self-pay

## 2020-11-23 ENCOUNTER — Other Ambulatory Visit (HOSPITAL_BASED_OUTPATIENT_CLINIC_OR_DEPARTMENT_OTHER): Payer: Self-pay

## 2020-11-26 ENCOUNTER — Other Ambulatory Visit (HOSPITAL_BASED_OUTPATIENT_CLINIC_OR_DEPARTMENT_OTHER): Payer: Self-pay

## 2020-11-27 ENCOUNTER — Other Ambulatory Visit (HOSPITAL_BASED_OUTPATIENT_CLINIC_OR_DEPARTMENT_OTHER): Payer: Self-pay

## 2021-01-16 ENCOUNTER — Other Ambulatory Visit (HOSPITAL_BASED_OUTPATIENT_CLINIC_OR_DEPARTMENT_OTHER): Payer: Self-pay

## 2021-01-21 ENCOUNTER — Other Ambulatory Visit: Payer: Self-pay

## 2021-03-11 ENCOUNTER — Other Ambulatory Visit (HOSPITAL_BASED_OUTPATIENT_CLINIC_OR_DEPARTMENT_OTHER): Payer: Self-pay
